# Patient Record
Sex: Male | Born: 1980 | Race: Black or African American | Hispanic: No | Marital: Married | Smoking: Current some day smoker
Health system: Southern US, Community
[De-identification: ages and names within clinical notes are randomized; demographics above are authoritative.]

## PROBLEM LIST (undated history)

## (undated) DIAGNOSIS — A630 Anogenital (venereal) warts: Secondary | ICD-10-CM

## (undated) DIAGNOSIS — E119 Type 2 diabetes mellitus without complications: Secondary | ICD-10-CM

## (undated) DIAGNOSIS — N529 Male erectile dysfunction, unspecified: Secondary | ICD-10-CM

## (undated) DIAGNOSIS — L0292 Furuncle, unspecified: Secondary | ICD-10-CM

## (undated) DIAGNOSIS — I10 Essential (primary) hypertension: Secondary | ICD-10-CM

## (undated) DIAGNOSIS — E785 Hyperlipidemia, unspecified: Secondary | ICD-10-CM

## (undated) DIAGNOSIS — G4730 Sleep apnea, unspecified: Secondary | ICD-10-CM

## (undated) DIAGNOSIS — L409 Psoriasis, unspecified: Secondary | ICD-10-CM

## (undated) DIAGNOSIS — K5792 Diverticulitis of intestine, part unspecified, without perforation or abscess without bleeding: Secondary | ICD-10-CM

## (undated) DIAGNOSIS — E559 Vitamin D deficiency, unspecified: Secondary | ICD-10-CM

## (undated) HISTORY — DX: Vitamin D deficiency, unspecified: E55.9

## (undated) HISTORY — DX: Furuncle, unspecified: L02.92

## (undated) HISTORY — DX: Psoriasis, unspecified: L40.9

## (undated) HISTORY — DX: Essential (primary) hypertension: I10

## (undated) HISTORY — DX: Anogenital (venereal) warts: A63.0

## (undated) HISTORY — DX: Hyperlipidemia, unspecified: E78.5

## (undated) HISTORY — DX: Sleep apnea, unspecified: G47.30

## (undated) HISTORY — PX: OTHER SURGICAL HISTORY: SHX169

## (undated) HISTORY — DX: Male erectile dysfunction, unspecified: N52.9

## (undated) HISTORY — DX: Type 2 diabetes mellitus without complications: E11.9

---

## 2007-06-17 ENCOUNTER — Emergency Department (HOSPITAL_COMMUNITY): Admission: EM | Admit: 2007-06-17 | Discharge: 2007-06-17 | Payer: Self-pay | Admitting: Emergency Medicine

## 2009-03-29 DIAGNOSIS — E559 Vitamin D deficiency, unspecified: Secondary | ICD-10-CM

## 2009-03-29 HISTORY — DX: Vitamin D deficiency, unspecified: E55.9

## 2009-06-27 DIAGNOSIS — A630 Anogenital (venereal) warts: Secondary | ICD-10-CM

## 2009-06-27 HISTORY — DX: Anogenital (venereal) warts: A63.0

## 2009-08-29 ENCOUNTER — Emergency Department (HOSPITAL_COMMUNITY): Admission: EM | Admit: 2009-08-29 | Discharge: 2009-08-29 | Payer: Self-pay | Admitting: Emergency Medicine

## 2010-05-19 LAB — LIPID PANEL
Cholesterol: 210 mg/dL — AB (ref 0–200)
HDL: 50 mg/dL (ref 35–70)
LDL Cholesterol: 129 mg/dL

## 2010-06-28 DIAGNOSIS — L0292 Furuncle, unspecified: Secondary | ICD-10-CM

## 2010-06-28 HISTORY — DX: Furuncle, unspecified: L02.92

## 2010-07-11 ENCOUNTER — Inpatient Hospital Stay (HOSPITAL_COMMUNITY)
Admission: AD | Admit: 2010-07-11 | Discharge: 2010-07-14 | DRG: 603 | Disposition: A | Discharge status: Home / Self Care | Payer: 59 | Source: Other Acute Inpatient Hospital | Attending: Family Medicine | Admitting: Family Medicine

## 2010-07-11 DIAGNOSIS — F17200 Nicotine dependence, unspecified, uncomplicated: Secondary | ICD-10-CM | POA: Diagnosis present

## 2010-07-11 DIAGNOSIS — I10 Essential (primary) hypertension: Secondary | ICD-10-CM

## 2010-07-11 DIAGNOSIS — B372 Candidiasis of skin and nail: Secondary | ICD-10-CM | POA: Diagnosis present

## 2010-07-11 DIAGNOSIS — L02219 Cutaneous abscess of trunk, unspecified: Principal | ICD-10-CM | POA: Diagnosis present

## 2010-07-11 DIAGNOSIS — L408 Other psoriasis: Secondary | ICD-10-CM | POA: Diagnosis present

## 2010-07-11 DIAGNOSIS — L03119 Cellulitis of unspecified part of limb: Secondary | ICD-10-CM

## 2010-07-11 DIAGNOSIS — L02419 Cutaneous abscess of limb, unspecified: Secondary | ICD-10-CM

## 2010-07-11 DIAGNOSIS — L03319 Cellulitis of trunk, unspecified: Principal | ICD-10-CM | POA: Diagnosis present

## 2010-07-11 DIAGNOSIS — E785 Hyperlipidemia, unspecified: Secondary | ICD-10-CM

## 2010-07-11 DIAGNOSIS — L0591 Pilonidal cyst without abscess: Secondary | ICD-10-CM | POA: Diagnosis present

## 2010-07-11 LAB — BASIC METABOLIC PANEL
Calcium: 9.1 mg/dL (ref 8.4–10.5)
GFR calc Af Amer: >60 mL/min (ref >60)
GFR calc non Af Amer: >60 mL/min (ref >60)
Glucose, Bld: 96 mg/dL (ref 70–99)
Potassium: 3.6 mEq/L (ref 3.5–5.1)
Sodium: 137 mEq/L (ref 135–145)

## 2010-07-11 LAB — CBC
HCT: 41.4 % (ref 39.0–52.0)
MCHC: 34.5 g/dL (ref 30.0–36.0)
Platelets: 325 10*3/uL (ref 150–400)
RDW: 12.8 % (ref 11.5–15.5)
WBC: 16.3 10*3/uL — ABNORMAL HIGH (ref 4.0–10.5)

## 2010-07-11 LAB — SURGICAL PCR SCREEN
MRSA, PCR: NEGATIVE
Staphylococcus aureus: NEGATIVE

## 2010-07-12 ENCOUNTER — Other Ambulatory Visit: Payer: Self-pay | Admitting: General Surgery

## 2010-07-12 LAB — RPR: RPR Ser Ql: NONREACTIVE

## 2010-07-12 LAB — HIV ANTIBODY (ROUTINE TESTING W REFLEX): HIV: NONREACTIVE

## 2010-07-13 LAB — CBC
MCH: 30.8 pg (ref 26.0–34.0)
MCHC: 34 g/dL (ref 30.0–36.0)
Platelets: 330 10*3/uL (ref 150–400)

## 2010-07-14 DIAGNOSIS — L02419 Cutaneous abscess of limb, unspecified: Secondary | ICD-10-CM

## 2010-07-14 DIAGNOSIS — I10 Essential (primary) hypertension: Secondary | ICD-10-CM

## 2010-07-14 DIAGNOSIS — L03119 Cellulitis of unspecified part of limb: Secondary | ICD-10-CM

## 2010-07-14 DIAGNOSIS — E785 Hyperlipidemia, unspecified: Secondary | ICD-10-CM

## 2010-07-14 LAB — CULTURE, ROUTINE-ABSCESS

## 2010-07-14 LAB — WOUND CULTURE: Culture: NO GROWTH

## 2010-07-17 NOTE — Op Note (Signed)
  NAMEWIRT, HEMMERICH               ACCOUNT NO.:  [id number]  MEDICAL RECORD NO.:  [id number]           PATIENT TYPE:  I  LOCATION:  5508                         FACILITY:  [facility]  PHYSICIAN:  Gabrielle Dare. Janee Morn, M.D.DATE OF BIRTH:  08-24-1980  DATE OF PROCEDURE:  07/12/2010 DATE OF DISCHARGE:                              OPERATIVE REPORT   PREOPERATIVE DIAGNOSIS:  Left groin abscess.  POSTOPERATIVE DIAGNOSIS:  Left groin abscess.  PROCEDURE:  Incision and drainage and debridement of left groin.  SURGEON:  Gabrielle Dare. Janee Morn, MD.  ANESTHESIA:  General endotracheal.  HISTORY OF PRESENT ILLNESS:  Mr. Grahn is a 30 year old African American gentleman who had a left groin cellulitis times approximately 1 week. He had a I and D done at [facility] and was given some intramuscular antibiotics, but continued to worsen, was admitted to the hospital.  We are proceeding with operative incision, drainage, and debridement.  PROCEDURE IN DETAIL:  Informed consent was obtained.  The patient was identified in the preop holding area.  He is on antibiotic regimen.  His site was marked.  He was brought to the operating room.  General endotracheal anesthesia was administered by the Anesthesia staff.  His left groin was prepped and draped in sterile fashion.  We did a time-out procedure.  Next, the wound was further explored.  There was a significant amount of induration both cephalad and caudal to his inguinal crease on the left side anteriorly, extending laterally from the I and D site.  The I and D site had frank purulent drainage.  This was sent for aerobic and anaerobic cultures.  Next, an elliptical incision was made to encompass all the indurated tissue going above and below at the groin crease.  This was all excised.  There was purulent material within the tissues underneath.  There was no frank necrotizing infection.  The ellipse was extended out laterally more because there was  another pocket of pus out that way.  Next, there was no further undrained areas of pus.  The wound was copiously irrigated with saline. No further extension of indurated tissue or pus was noted.  The hemostasis was obtained with Bovie cautery.  The wound was irrigated somewhat further and then packed with saline-soaked Kerlix gauze followed by sterile gauze dressing.  Sponge, needle, and instrument counts were all correct.  The patient tolerated the procedure without any apparent complications, was taken to recovery in stable condition.  We will plan dressing change tomorrow for reevaluation of the wound.     Gabrielle Dare Janee Morn, M.D.     BET/MEDQ  D:  07/12/2010  T:  07/12/2010  Job:  [id number]  cc:   Deniece Portela A. Sheffield Slider, M.D. [facility]  Electronically Signed by Violeta Gelinas M.D. on 07/17/2010 10:57:15 AM

## 2010-07-18 LAB — CULTURE, BLOOD (ROUTINE X 2)
Culture  Setup Time: 201204150018
Culture: NO GROWTH

## 2010-07-19 LAB — ANAEROBIC CULTURE

## 2010-07-20 NOTE — Consult Note (Signed)
Bruce Macias, Bruce Macias               ACCOUNT NO.:  [id number]  MEDICAL RECORD NO.:  [id number]           PATIENT TYPE:  I  LOCATION:  5508                         FACILITY:  [facility]  PHYSICIAN:  Mary Sella. Andrey Campanile, MD     DATE OF BIRTH:  01-11-1981  DATE OF CONSULTATION:  07/11/2010 DATE OF DISCHARGE:                                CONSULTATION   REQUESTING PHYSICIAN:  [name], MD, at the [facility] Teaching Service with the attending being Dr. Sheffield Slider.  PHYSICIAN PERFORMING CONSULTATION:  Mary Sella. Andrey Campanile, MD  REASON FOR CONSULTATION:  Left groin abscess and cellulitis.  CHIEF COMPLAINT:  I am having infection.  HISTORY OF PRESENT ILLNESS:  The patient is a 30 year old morbidly obese African male with history of psoriasis who has left groin cellulitis for about a week. old morbidly obese African male with history of psoriasis who has left groin cellulitis for about a week.  He said it started off as a pimple that suddenly got larger and became more tender over the past couple of days.  He initially went to [facility] 3 days ago where he had a small incision and drainage with the wound culture, and was given an injection of an antibiotic.  He was also given prescription for doxycycline.  He went back yesterday for another wound check and got another injection of an antibiotic.  He returned today on Saturday.  Since there had been no improvement and potentially worsening of the cellulitis, he was sent over to [facility] for admission to the teaching service.  He denies any other history of abscesses.  He does have a history of a pilonidal cyst that has been drained in the past by Dr. Donell Beers in her office.  He reports some subjective fevers and chills.  He denies any weight loss. He denies any night sweats.  PAST MEDICAL HISTORY: 1. Morbid obesity. 2. Psoriasis. 3. Hypertension. 4. History of pilonidal cyst.  PAST SURGICAL HISTORY:  Incision and drainage of pilonidal cyst in an office setting.  MEDICATIONS AT HOME: 1. Soriatane. 2. Taclonex. 3.  Lisinopril. 4. Simvastatin. 5. Chantix.  HOSPITAL MEDICATIONS:  Vancomycin, Zosyn, lisinopril, and simvastatin.  ALLERGIES:  No known drug allergies.  REVIEW OF SYSTEMS:  He denies any chest pain, dyspnea on exertion, diarrhea, or constipation.  He does endorse significant snoring and some sleepiness during the daytime.  He does have some intermittent drainage from his pilonidal cyst.  He does not have any pain in that area currently.  He does have left groin pain.  Otherwise, comprehensive 12- point review of systems is negative except as mentioned in the HPI.  FAMILY HISTORY:  Significant for obesity.  SOCIAL HISTORY:  He is married.  He is smoking, but he is cutting down. He is taking Chantix.  He has been smoking for about 5 years, but is actually cutting down its use.  He drinks alcohol on occasional basis. He has used marijuana in the past, but that has been several months.  PHYSICAL EXAM:  VITAL SIGNS:  Temperature 99.2, heart rate 106, blood pressure 129/81, 99%. GENERAL:  Morbidly obese, African American male, in no apparent distress. HEENT:  Atraumatic, normocephalic.  Pupils are equal,  round.  No scleral icterus.  No external ear lesions.  Hearing grossly normal.  NECK: Supple and short and thick. PULMONARY:  Lungs are clear to auscultation.  Symmetric chest rise.  No accessory use of muscles. CARDIOVASCULAR:  Regular rhythm.  No murmurs. ABDOMEN:  Morbidly obese.  He has a very large abdomen and large pannus. It is soft, nontender. GU:  In his left inguinal region and left proximal thigh, he has extensive thick cellulitis extending to his proximal thigh medially and it goes laterally.  The area of cellulitis is probably 14 inches x 10 inches.  There is an incision in the left inguinal area that is about 1 inch in length with some packing in it.  There is some active purulent drainage.  There is some fluctuance and significant amount of induration in that area.  The  induration is probably about 8 inches extending laterally.  There are no signs of crepitus. NEURO:  Nonfocal.  Sensation is grossly intact. PSYCHIATRIC:  Alert and oriented x3. SKIN:  See above.  He also has a chronic pilonidal cyst.  It has got two pinpoint areas of drainage to the right of the gluteal cleft.  There is no cellulitis.  However, I can express some trace purulence.  LABS:  Sodium 137, potassium 3.6, chloride 101, bicarb 28, BUN 6, creatinine 0.91, sugar 96, calcium 9.1.  White count 16.3, hematocrit 41, hemoglobin 14, platelet count 325.  IMPRESSION:  A 30 year old [place] American male with; 1. Morbid obesity. 2. Hypertension. 3. Psoriasis. 4. History of tobacco abuse. 5. Chronic pilonidal cyst. 6. Left groin cellulitis and abscess.ines American male with; 1. Morbid obesity. 2. Hypertension. 3. Psoriasis. 4. History of tobacco abuse. 5. Chronic pilonidal cyst. 6. Left groin cellulitis and abscess.  PLAN:  I agree with IV antibiotics.  We will make him n.p.o. after midnight.  The plan will be for him to go to the operating room in the morning for incision and drainage and possible debridement of the left groin and thigh.  He does need definitive management of the pilonidal cyst.  Right now, there are no overlying signs of overt infection.  He does have some chronic drainage.  However, there are no signs of cellulitis.  This is going to be a pretty extensive incision and drainage of his left groin.  Therefore, I would defer management of his pilonidal cyst until he is outpatient heal from the left groin.  I discussed the risks and benefits of the surgery with his wife and his mother and the patient. We discussed the risk of bleeding, infection, need for additional debridements, possibly worsening infection, possibility of a DVT occurrence, possible injury to superficial nerves, and scarring.     Mary Sella. Andrey Campanile, MD     EMW/MEDQ  D:  07/11/2010  T:  07/12/2010  Job:  [id number]  Electronically Signed by Gaynelle Adu M.D. on 07/20/2010 02:56:52 PM

## 2010-07-27 NOTE — H&P (Signed)
Bruce Macias, LIENHARD               ACCOUNT NO.:  [id number]  MEDICAL RECORD NO.:  [id number]           PATIENT TYPE:  I  LOCATION:  5508                         FACILITY:  [facility]  PHYSICIAN:  Jaryd Drew A. Sheffield Slider, M.D.    DATE OF BIRTH:  1980-08-05  DATE OF ADMISSION:  07/11/2010 DATE OF DISCHARGE:                             HISTORY & PHYSICAL   PRIMARY CARE PHYSICIAN:  Dr. Ala Bent at [facility].   CHIEF COMPLAINT:  Left groin abscess.  HISTORY OF PRESENT ILLNESS:  The patient is a 30 year old male with a known history of psoriasis with chronic pilonidal abscess who presents with 1-week history of left groin swelling that started as a small, less than 1 mm raised bump that increased in size with developing surrounding cellulitis.  She was evaluated in the PCP office last Thursday where I and D was performed and he was started on p.o. doxycycline.  He was re- evaluated on Friday and was given IM ceftriaxone.  He was once again evaluated on Saturday where he was found to be febrile with temperature of 100.5, elevated white blood cell count, and to have progressive cellulitis spreading up to his left groin.  He admits to tenderness at the site. He denies nausea, vomiting, diarrhea, penile discharge, chest pain, and shortness of breath.  Code status is full code.  ALLERGIES:  No known drug allergies.  MEDICATIONS: 1. Lasix 20 mg p.o. every other day. 2. Taclonex 0.0055%/0.06% ointment to affected areas as needed. 3. Simvastatin. 4. Lisinopril. 5. Chantix.  PAST MEDICAL HISTORY:  Negative for diabetes.  Positive for hyperlipidemia, hypertension, tobacco one pack per week for 5-6 years. He quit 3 months ago. No previous hospitalizations.  PAST SURGICAL HISTORY:  No surgeries.  SOCIAL HISTORY:  The patient lives with wife and daughter.  He is employed at the [employer].  He quit smoking and drinks one shot of alcohol a week or one drink per week,  occasional marijuana use.  FAMILY HISTORY:  Positive for mother with alopecia.  Sister with hypertension.  REVIEW OF SYSTEMS:  GENERAL:  Positive for fevers.  HEENT:  Positive for headache.  SKIN:  Positive for rash, otherwise negative.  PHYSICAL EXAMINATION:  VITAL SIGNS:  Temperature 99.2, pulse 113, respiratory rate 18, blood pressure 172/105 down to 129/81, and O2 sat 97% on room air.  Weight 166 kg, height 70 inches, BMI 32.5. GENERAL:  Obese male, lying in bed, in no acute distress. HEENT:  Normocephalic, atraumatic.  Moist mucous membranes. NECK:  Supple without lymphadenopathy.  No rash. CVS:  S1 and S2.  Regular rate and rhythm.  No murmurs, rubs, or gallops. LUNGS:  Normal work of breathing.  Bilateral wheezing that clears with cough.  No crackles.  No rhonchi. ABDOMEN:  Obese, soft, nontender, nondistended.  No masses, atrophy, and macular rash per pannus. BACK:  Nontender. GU:  Left groin with 5 x 3 cm area of induration  with purulent serous discharge from the surgical site with packing.  Surrounding erythema extends up left groin. RECTAL:  5 x 5 incision at natal cleft consistent with  pilonidal abscess with cellulitis and induration. EXTREMITIES:  Nontender.  No edema. NEURO:  Alert and oriented x3.  Sensory and motor intact.  Peripheral pulses 5/5 strength in bilateral upper and lower extremities.  STUDIES:  None.  Wound culture performed is pending.  ASSESSMENT AND PLAN:  This is a 30 year old male with left groin abscess and cellulitis. 1. Left groin abscess and cellulitis.  The patient with predisposing     factors with obesity, and what appears to be such as candidal intertrigo,     He has failed outpatient therapy.  Plan for close monitoring, contact precautions.       Obtain blood and wound culture.  Start IV     vancomycin and Zosyn.  Surgicsal consult called, Dr. Andrey Campanile to     evaluate some time this evening.  We will also obtain HIV and RPR     to  evaluate for possible immunocompromised state. 2. Candidal intertrigo, topical fungal cream to abdominal skin folds     and to wounds of the groin. 3. Hypertension.  The patient with known history of hypertension.  His     blood pressure are elevated on admission likely secondary to stress.      They are now back to baseline. Plan to restart home meds and order p.r.n. hydralazine. 4. Hyperlipidemia.  Restart on statin. 5. Fluids, electrolytes, nutrition and gastrointestinal.  N.p.o. for     possible OR this evening.  Normal saline IV at 150 mL per hour. 6. DVT prophylaxis.  Heparin 5000 units subcu b.i.d. 7. Disposition.  Pending clinical improvement.    ______________________________ Bruce Phi, MD   ______________________________ Bruce Macias. Sheffield Slider, M.D.    JF/MEDQ  D:  07/11/2010  T:  07/12/2010  Job:  [id number]  Electronically Signed by Bruce Phi MD on 07/19/2010 01:41:39 AM Electronically Signed by Zachery Dauer M.D. on 07/27/2010 04:41:10 AM

## 2010-08-13 NOTE — Discharge Summary (Signed)
NAMEDETRELL, UMSCHEID               ACCOUNT NO.:  [id number]  MEDICAL RECORD NO.:  [id number]           PATIENT TYPE:  I  LOCATION:  5508                         FACILITY:  [facility]  PHYSICIAN:  Santiago Bumpers. Hensel, M.D.DATE OF BIRTH:  03/21/1981  DATE OF ADMISSION:  07/11/2010 DATE OF DISCHARGE:  07/14/2010                              DISCHARGE SUMMARY   DISCHARGE DIAGNOSES: 1. Hypertension. 2. Left groin abscess and cellulitis. 3. Morbid obesity. 4. History of psoriasis. 5. Chronic pilonidal abscess. 6. Hypertension. 7. Hyperlipidemia. 8. Previous tobacco abuse.  DISCHARGE MEDICATIONS:  New medications at the discharged: 1. Augmentin 500 mg 1 tablet p.o. q.8 for next 10 days. 2. Tylenol Extra Strength 500 mg 2 tablets p.o. q.4 as needed for     pain. 3. Lotrimin 1% cream 1 application topically twice daily as needed. 4. Oxycodone 5 mg IR 10 mg p.o. every 4 hours as needed for pain.  Home medications changed: 1. Lisinopril 20 mg 2 tablets p.o. daily. 2. Simvastatin 20 mg 1 tablet p.o. daily. 3. Soriatane 25 mg 1 capsule p.o. daily. 4. Tramadol 50 mg 1 tablet p.o. daily as needed for pain. 5. Triamcinolone topical ointment 1% one application topically daily.  Home medications stopped: Doxycycline and clindamycin.  CONSULTS:  General Surgery.  PERTINENT LABS AT DISCHARGE:  Blood cultures no growth to date.  No final abscess culture.  Multiple organisms present, non predominant, no staph aureus isolated, no group A strep isolated.  White blood cells 2.8.  HIV negative.  RPR negative.  BRIEF HOSPITAL COURSE: 1. The patient is a 30 year old male who presented with a left groin     abscess and cellulitis, failed outpatient treatment with     doxycycline, clindamycin, and IM ceftriaxone.  The patient was     admitted.  Blood and wound cultures were obtained.  He was started     on IV vanc and Zosyn, and as well as IV fluids.  He was also given     IV Dilaudid and oxycodone  p.r.n. for pain.  Surgery consult was     obtained.  The patient was taken to the OR for I and D of left     groin abscess on July 12, 2010, the patient did well postop.  His     pain was well controlled with IV and then he was then transitioned     to p.o. medications.  The wound care was initially with wet to dry     dressings to be changed twice daily.  The patient was then     reevaluated and was recommended that the patient has negative     pressure wound VAC, and to be set up with home health RN to change     wound VAC 3 times weekly.  At discharge, the patient is doing well.     His white count trending down.  He is afebrile.  His pain is well     controlled and he has home health RN set up to apply and change     negative pressure wound VAC 3 times weekly. 2. Hypertension.  The patient's blood pressure were elevated on his     admission, but did trend down with time.  His home dose of     lisinopril was increased to 40 mg p.o. daily.  The patient did not     require p.r.n. hydralazine. 3. Candidal intertrigo.  The patient has evidence of candidal     intertrigo after his abdominal pannus as well as staples of his     upper thigh.  He was instructed to apply to Lotrimin cream to the     area twice daily as needed. 4. History of hyperlipidemia.  The patient was continued on his home     dose of simvastatin. 5. History of psoriasis.  The patient's right hand in triamcinolone     topical was held during his admission.  He was instructed to resume     both as needed following discharge.  DISCHARGE PHYSICAL EXAM:  At discharge, the patient is in no acute distress.  Heart and lung exam were within normal limits.  Examination of his left groin abscess shows markedly decreased cellulitis.  The areas immediately surrounding the incision was indurated, but there was little drainage from the wound.  The drainage that is present is nonpurulent and serous.  DISCHARGE INSTRUCTIONS:  The  patient is discharged to home with instructions to advance activity slowly.  No limitations on diet.  He was instructed to follow up with Dr. Janee Morn, general surgeon.  He was provided the phone number and is instructed to follow up in 7-10 days. He is also instructed to follow up with his primary care physician, Dr. Ala Bent at [facility].  He is instructed to follow up for hospital followup and for blood pressure check in 1-2 weeks.  He is also instructed to begin home health RN, with a vent with negative pressure wound VAC 3 times per weeks as needed until wound is healed.  DISCHARGE CONDITION:  The patient is discharged to home in stable medical condition.    ______________________________ [name], MD   ______________________________ Santiago Bumpers. Leveda Anna, M.D.    JF/MEDQ  D:  07/19/2010  T:  07/19/2010  Job:  [id number]  Electronically Signed by [name] MD on 08/07/2010 02:59:02 PM Electronically Signed by Doralee Albino M.D. on 08/13/2010 11:00:26 AM

## 2011-04-06 ENCOUNTER — Encounter (INDEPENDENT_AMBULATORY_CARE_PROVIDER_SITE_OTHER): Payer: 59 | Admitting: Physician Assistant

## 2011-04-06 DIAGNOSIS — Z23 Encounter for immunization: Secondary | ICD-10-CM

## 2011-04-06 DIAGNOSIS — F17200 Nicotine dependence, unspecified, uncomplicated: Secondary | ICD-10-CM

## 2011-04-06 DIAGNOSIS — Z0000 Encounter for general adult medical examination without abnormal findings: Secondary | ICD-10-CM

## 2011-04-06 DIAGNOSIS — I10 Essential (primary) hypertension: Secondary | ICD-10-CM

## 2011-04-07 LAB — LIPID PANEL
Cholesterol: 206 mg/dL — AB (ref 0–200)
Triglycerides: 106 mg/dL (ref 40–160)

## 2011-05-04 ENCOUNTER — Ambulatory Visit: Payer: 59 | Admitting: Physician Assistant

## 2011-05-12 ENCOUNTER — Encounter: Payer: Self-pay | Admitting: Family Medicine

## 2011-05-14 ENCOUNTER — Encounter: Payer: Self-pay | Admitting: Family Medicine

## 2011-05-14 ENCOUNTER — Ambulatory Visit (INDEPENDENT_AMBULATORY_CARE_PROVIDER_SITE_OTHER): Payer: 59 | Admitting: Family Medicine

## 2011-05-14 DIAGNOSIS — R7402 Elevation of levels of lactic acid dehydrogenase (LDH): Secondary | ICD-10-CM

## 2011-05-14 DIAGNOSIS — E782 Mixed hyperlipidemia: Secondary | ICD-10-CM

## 2011-05-14 DIAGNOSIS — G4730 Sleep apnea, unspecified: Secondary | ICD-10-CM | POA: Insufficient documentation

## 2011-05-14 DIAGNOSIS — G4733 Obstructive sleep apnea (adult) (pediatric): Secondary | ICD-10-CM

## 2011-05-14 DIAGNOSIS — E669 Obesity, unspecified: Secondary | ICD-10-CM

## 2011-05-14 DIAGNOSIS — R748 Abnormal levels of other serum enzymes: Secondary | ICD-10-CM

## 2011-05-14 DIAGNOSIS — E785 Hyperlipidemia, unspecified: Secondary | ICD-10-CM | POA: Insufficient documentation

## 2011-05-14 DIAGNOSIS — R7401 Elevation of levels of liver transaminase levels: Secondary | ICD-10-CM

## 2011-05-14 DIAGNOSIS — Z6841 Body Mass Index (BMI) 40.0 and over, adult: Secondary | ICD-10-CM | POA: Insufficient documentation

## 2011-05-14 DIAGNOSIS — I10 Essential (primary) hypertension: Secondary | ICD-10-CM

## 2011-05-14 LAB — COMPREHENSIVE METABOLIC PANEL
ALT: 55 U/L — ABNORMAL HIGH (ref 0–53)
AST: 48 U/L — ABNORMAL HIGH (ref 0–37)
Albumin: 4 g/dL (ref 3.5–5.2)
Alkaline Phosphatase: 78 U/L (ref 39–117)
Potassium: 4 mEq/L (ref 3.5–5.3)
Sodium: 138 mEq/L (ref 135–145)
Total Protein: 7 g/dL (ref 6.0–8.3)

## 2011-05-14 MED ORDER — HYDROCHLOROTHIAZIDE 25 MG PO TABS: 25.0000 mg | ORAL_TABLET | Freq: Every day | ORAL | Status: DC

## 2011-05-14 NOTE — Progress Notes (Signed)
  Subjective:    Patient ID: Bruce Macias, male    DOB: 06-Dec-1980, 31 y.o.   MRN: [id number]  HPIAntonio Macias is a 31 y.o. male last seen 04/06/11 for CPE by Kennedy Bucker, PAC.  See scanned note.    Hyperlipidemia - lipids 04/06/10: TC206, LDL 136, HDL 49, trig 106.  Previously on simvastatin - stopped last year - rx ran out.  LFT's last OV slightly elevated - AST 57, ALT 61.  Rare alcohol  1-2 drinks every  Other weekend.  No abd pain, nausea, or vomiting.   Occasional ibuprofen for back pain.  Htn - Started on HCTZ 25 mg qd 04/06/11.  No new side effects with meds. Weight 404 to 402 since last ov.  No specific exercise outside of work - works 2 jobs.  Rare sweet teas or sodas now - has cut back.    Hx of OSA - using CPAP now - past month every night.    Review of Systems  Constitutional: Negative for fever, chills, fatigue and unexpected weight change.  HENT: Negative for sore throat, trouble swallowing and tinnitus.   Eyes: Negative for visual disturbance.  Respiratory: Negative for cough, chest tightness and shortness of breath.   Cardiovascular: Negative for chest pain, palpitations and leg swelling.  Gastrointestinal: Negative for nausea, vomiting, abdominal pain, diarrhea, constipation and blood in stool.  Genitourinary: Negative for difficulty urinating.  Neurological: Negative for dizziness, light-headedness and headaches.       Objective:   Physical Exam  Constitutional: He is oriented to person, place, and time. He appears well-developed and well-nourished.  HENT:  Head: Normocephalic and atraumatic.  Eyes: EOM are normal. Pupils are equal, round, and reactive to light.  Neck: No JVD present. Carotid bruit is not present. No thyromegaly present.  Cardiovascular: Normal rate, regular rhythm and normal heart sounds.   No murmur heard. Pulmonary/Chest: Effort normal and breath sounds normal. He has no rales.  Abdominal: Soft. There is no tenderness. There is no rebound and no  guarding.  Musculoskeletal: He exhibits no edema.  Neurological: He is alert and oriented to person, place, and time.  Skin: Skin is warm and dry.  Psychiatric: He has a normal mood and affect.          Assessment & Plan:   1. HTN (hypertension)  Comprehensive metabolic panel  2. Obesity    3. Hyperlipidemia  Comprehensive metabolic panel  4. OSA (obstructive sleep apnea)    5. Elevated liver enzymes     Improved BP control.  Stressed importance of continued weight loss through exercise and portion control.  Continue CPAP  Elevated LFT's possibly due to fatty liver with obesity.  Recheck CMP, and consider low dose statin if LFT's stable, and then recheck LFT's 4-6 weeks into statin.  Recheck with Helmut Muster in 3 months.

## 2011-05-14 NOTE — Patient Instructions (Signed)
Continue to work on weight loss with exercise and portion control.  If liver enzymes ok , may be able to restart a cholesterol medicine.  Expect a call about lab results in next 7-10 days. Return to the clinic or go to the nearest emergency room if any of your symptoms worsen or new symptoms occur.

## 2011-05-20 ENCOUNTER — Telehealth: Payer: Self-pay

## 2011-05-24 ENCOUNTER — Telehealth: Payer: Self-pay

## 2011-05-24 NOTE — Telephone Encounter (Signed)
Pt CB to get lab results from last OV. Gave pt results and instructions from MD. Pt agreed to f/up. Called in Lovastatin Rx into [facility]

## 2011-06-01 ENCOUNTER — Encounter: Payer: Self-pay | Admitting: Physician Assistant

## 2011-08-10 ENCOUNTER — Ambulatory Visit: Payer: 59 | Admitting: Physician Assistant

## 2011-08-24 ENCOUNTER — Other Ambulatory Visit: Payer: Self-pay | Admitting: Family Medicine

## 2011-08-28 ENCOUNTER — Ambulatory Visit: Payer: 59

## 2011-08-28 ENCOUNTER — Ambulatory Visit (INDEPENDENT_AMBULATORY_CARE_PROVIDER_SITE_OTHER): Payer: 59 | Admitting: Family Medicine

## 2011-08-28 VITALS — BP 131/87 | HR 91 | Temp 97.9°F | Resp 20 | Ht 69.0 in | Wt 398.0 lb

## 2011-08-28 DIAGNOSIS — M25562 Pain in left knee: Secondary | ICD-10-CM

## 2011-08-28 DIAGNOSIS — M25569 Pain in unspecified knee: Secondary | ICD-10-CM

## 2011-08-28 MED ORDER — METHYLPREDNISOLONE 4 MG PO TABS: ORAL_TABLET | ORAL | Status: DC

## 2011-08-28 NOTE — Progress Notes (Signed)
This 31 year old gentleman comes in with complaint of left knee pain. 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 He seemed to stumble as he went for lap as he tripped over a stump. He had immediate pain but seemed to take it off and Lying. The next day, the knee was stiff and sore. He's noted some popping in the knee as well. He's had no significant swelling and no point tenderness. Most of the aching seems to be in the anterior portion of the knee.  Patient has a history of a previous injury which seemed to resolve several years ago. He was in pretty good condition when this happened.  Objective: Obese man in no acute distress. Left knee exam: Knee inspection: Normal , no effusion Knee palpation: Nontender Knee ligamentous testing: No laxity or pain The range of motion: Full Skin: No ecchymosis or abrasions. [facility] reading (PRIMARY) by  Dr. Milus Glazier:  Left Knee  Negative for bony abnormalities or effusion  Assessment: Knee sprain. Cannot rule out cartilage injury as well.  Plan: Medrol 4 mg, 2 daily x5 days and then recheck.

## 2011-08-28 NOTE — Patient Instructions (Addendum)
Knee Sprain  You have a knee sprain. Sprains are painful injuries to the joints. A sprain is a partial or complete tearing of ligaments. Ligaments are tough, fibrous tissues that hold bones together at the joints. A strain (sprain) has occurred when a ligament is stretched or damaged. This injury may take several weeks to heal. This is often the same length of time as a bone fracture (break in bone) takes to heal. Even though a fracture (bone break) may not have occurred, the recovery times may be similar.  HOME CARE INSTRUCTIONS   · Rest the injured area for as long as directed by your caregiver. Then slowly start using the joint as directed by your caregiver and as the pain allows. Use crutches as directed. If the knee was splinted or casted, continue use and care as directed. If an ace bandage has been applied today, it should be removed and reapplied every 3 to 4 hours. It should not be applied tightly, but firmly enough to keep swelling down. Watch toes and feet for swelling, bluish discoloration, coldness, numbness or excessive pain. If any of these symptoms occur, remove the ace bandage and reapply more loosely.If these symptoms persist, seek medical attention.  · For the first 24 hours, lie down. Keep the injured extremity elevated on two pillows.  · Apply ice to the injured area for 15 to 20 minutes every couple hours. Repeat this 3 to 4 times per day for the first 48 hours. Put the ice in a plastic bag and place a towel between the bag of ice and your skin.  · Wear any splinting, casting, or elastic bandage applications as instructed.  · Only take over-the-counter or prescription medicines for pain, discomfort, or fever as directed by your caregiver. Do not use aspirin immediately after the injury unless instructed by your caregiver. Aspirin can cause increased bleeding and bruising of the tissues.  · If you were given crutches, continue to use them as instructed. Do not resume weight bearing on the  affected extremity until instructed.  Persistent pain and inability to use the injured area as directed for more than 2 to 3 days are warning signs. If this happens you should see a caregiver for a follow-up visit as soon as possible. Initially, a hairline fracture (this is the same as a broken bone) may not be evident on x-rays. Persistent pain and swelling indicate that further evaluation, non-weight bearing (use of crutches as instructed), and/or further x-rays are indicated. X-rays may sometimes not show a small fracture until a week or ten days later. Make a follow-up appointment with your own caregiver or one to whom we have referred you. A radiologist (specialist in reading x-rays) may re-read your X-rays. Make sure you know how you are to get your x-ray results. Do not assume everything is normal if you do not hear from us.  SEEK MEDICAL CARE IF:   · Bruising, swelling, or pain increases.  · You have cold or numb toes  · You have continuing difficulty or pain with walking.  SEEK IMMEDIATE MEDICAL CARE IF:   · Your toes are cold, numb or blue.  · The pain is not responding to medications and continues to stay the same or get worse.  MAKE SURE YOU:   · Understand these instructions.  · Will watch your condition.  · Will get help right away if you are not doing well or get worse.  Document Released: 03/15/2005 Document Revised: 03/04/2011 Document Reviewed: 02/27/2007    [employer]® Patient Information ©2012 [employer].

## 2011-09-07 ENCOUNTER — Ambulatory Visit: Payer: 59 | Admitting: Physician Assistant

## 2011-11-01 ENCOUNTER — Encounter: Payer: Self-pay | Admitting: Physician Assistant

## 2012-04-12 ENCOUNTER — Ambulatory Visit (INDEPENDENT_AMBULATORY_CARE_PROVIDER_SITE_OTHER): Payer: 59 | Admitting: Family Medicine

## 2012-04-12 VITALS — BP 161/82 | HR 114 | Temp 98.7°F | Resp 22 | Ht 69.5 in | Wt 398.0 lb

## 2012-04-12 DIAGNOSIS — L0291 Cutaneous abscess, unspecified: Secondary | ICD-10-CM

## 2012-04-12 DIAGNOSIS — M79609 Pain in unspecified limb: Secondary | ICD-10-CM

## 2012-04-12 DIAGNOSIS — L0390 Cellulitis, unspecified: Secondary | ICD-10-CM

## 2012-04-12 LAB — POCT CBC
Hemoglobin: 15.9 g/dL (ref 14.1–18.1)
Lymph, poc: 3 (ref 0.6–3.4)
MCH, POC: 29.1 pg (ref 27–31.2)
MCHC: 30.4 g/dL — AB (ref 31.8–35.4)
MID (cbc): 1 — AB (ref 0–0.9)
MPV: 7.5 fL (ref 0–99.8)
POC LYMPH PERCENT: 17 %L (ref 10–50)
POC MID %: 5.9 %M (ref 0–12)
WBC: 17.4 10*3/uL — AB (ref 4.6–10.2)

## 2012-04-12 MED ORDER — KETOROLAC TROMETHAMINE 60 MG/2ML IM SOLN
60.0000 mg | Freq: Once | INTRAMUSCULAR | Status: AC
  Administered 2012-04-12: 60 mg via INTRAMUSCULAR

## 2012-04-12 MED ORDER — SULFAMETHOXAZOLE-TRIMETHOPRIM 800-160 MG PO TABS: 2.0000 | ORAL_TABLET | Freq: Two times a day (BID) | ORAL | Status: DC

## 2012-04-12 MED ORDER — OXYCODONE-ACETAMINOPHEN 5-325 MG PO TABS: 1.0000 | ORAL_TABLET | ORAL | Status: DC | PRN

## 2012-04-12 NOTE — Progress Notes (Signed)
   Patient ID: Bruce Macias MRN: [id number], DOB: 1981-03-14, 32 y.o. Date of Encounter: 04/12/2012, 8:07 PM    PROCEDURE NOTE: Verbal consent obtained. Risks and benefits of the procedure were explained to the patient. Patient made an informed decision to proceed with the procedure. Betadine prep per usual protocol. Local anesthesia obtained with 2% plain lidocaine 5 cc.  1.5 inch incision made with 11 blade along lesion.  Culture taken. Copious purulence expressed. Lesion explored revealing no loculations. Irrigated with normal saline. Packed with large amount of 1/2 plain packing Dressed. Wound care instructions including precautions with patient. Patient tolerated the procedure well. Recheck in 24 hours with Ms. Marte, PA-C. First dose of Bactrim given in office tonight. ER precautions discussed. Advised he can contact us 24 hours via phone. Advised to check his temperature at home. Consider Rocephin.  Smaller lesion at 12 o'clock to above wound also numbed with 2% plain lidocaine 1 cc. Lanced with 11 blade. 0.5 cm incision made. Small amount of purulence expressed. Irrigated with normal saline. Wound explored, no loculations revealed. Wound dressed.   Labs: Results for orders placed in visit on 04/12/12  POCT CBC      Component Value Range   WBC 17.4 (*) 4.6 - 10.2 K/uL   Lymph, poc 3.0  0.6 - 3.4   POC LYMPH PERCENT 17.0  10 - 50 %L   MID (cbc) 1.0 (*) 0 - 0.9   POC MID % 5.9  0 - 12 %M   POC Granulocyte 13.4 (*) 2 - 6.9   Granulocyte percent 77.1  37 - 80 %G   RBC 5.47  4.69 - 6.13 M/uL   Hemoglobin 15.9  14.1 - 18.1 g/dL   HCT, POC 57.8  46.9 - 53.7 %   MCV 95.6  80 - 97 fL   MCH, POC 29.1  27 - 31.2 pg   MCHC 30.4 (*) 31.8 - 35.4 g/dL   RDW, POC 62.9     Platelet Count, POC 463 (*) 142 - 424 K/uL   MPV 7.5  0 - 99.8 fL       Signed, Eula Listen, PA-C 04/12/2012 8:07 PM

## 2012-04-12 NOTE — Progress Notes (Signed)
  Subjective:    Patient ID: Bruce Macias, male    DOB: 11-14-1980, 32 y.o.   MRN: [id number] Chief Complaint  Patient presents with  . spot on back    HPI  [name] has had a h/o abscess and boils which have needed to be lanced and drained at times, has had to "have surgery" on them.  He has developed one at the top of his gluteal crease for about a wk which has steadily worsened and become very painful- painful to sit and bend over.  While he was in the office waiting to be seen by a provider, it actually spontaneously burst and has had copious odiferous liquid purulent drainage - spilling down his buttocks, clothing, onto floor, etc. History reviewed. No pertinent past medical history. History reviewed. No pertinent past surgical history. No current outpatient prescriptions on file prior to visit.   No Known Allergies  Review of Systems  Constitutional: Positive for diaphoresis and fatigue. Negative for fever, chills, activity change, appetite change and unexpected weight change.  Cardiovascular: Negative for leg swelling.  Gastrointestinal: Negative for nausea, vomiting, abdominal pain, diarrhea, constipation and rectal pain.  Musculoskeletal: Positive for myalgias and back pain. Negative for joint swelling and gait problem.  Skin: Positive for rash and wound.  Neurological: Negative for weakness and numbness.  Hematological: Negative for adenopathy. Does not bruise/bleed easily.  Psychiatric/Behavioral: Positive for sleep disturbance.      BP 161/82  Pulse 114  Temp 98.7 F (37.1 C) (Oral)  Resp 22  Ht 5' 9.5" (1.765 m)  Wt 398 lb (180.532 kg)  BMI 57.93 kg/m2  SpO2 93% Objective:   Physical Exam  Constitutional: He is oriented to person, place, and time. He appears well-developed and well-nourished. No distress.       Morbidly obese  HENT:  Head: Normocephalic and atraumatic.  Eyes: No scleral icterus.  Pulmonary/Chest: Effort normal.  Neurological: He is alert and  oriented to person, place, and time.  Skin: Skin is warm and dry. Lesion noted. He is not diaphoretic.       Large erythematous exquisitely tender mass approx 1 x 3 in on upper right gluteal crease with surrounding fluctuance up to 5 in dm - poorly defined and very tender. Spontaneously draining copious purulent thin material.  Psychiatric: He has a normal mood and affect. His behavior is normal.      Assessment & Plan:  Abscess -Gave toradol 60mg  IM x 1 prior to I&D by Eula Listen, PA-C in office tonight. Start Bactrim DS tonight x 10d. Wound clx P.   Recheck in office tomorrow.  Meds ordered this encounter  Medications  . oxyCODONE-acetaminophen (ROXICET) 5-325 MG per tablet    Sig: Take 1 tablet by mouth every 4 (four) hours as needed for pain.    Dispense:  20 tablet    Refill:  0  . sulfamethoxazole-trimethoprim (BACTRIM DS,SEPTRA DS) 800-160 MG per tablet    Sig: Take 2 tablets by mouth 2 (two) times daily.    Dispense:  40 tablet    Refill:  0  . ketorolac (TORADOL) injection 60 mg    Sig:

## 2012-04-13 ENCOUNTER — Ambulatory Visit (INDEPENDENT_AMBULATORY_CARE_PROVIDER_SITE_OTHER): Payer: 59 | Admitting: Physician Assistant

## 2012-04-13 VITALS — BP 121/84 | HR 105 | Temp 98.1°F | Resp 18 | Ht 69.5 in | Wt 398.0 lb

## 2012-04-13 DIAGNOSIS — L0231 Cutaneous abscess of buttock: Secondary | ICD-10-CM

## 2012-04-13 DIAGNOSIS — L03317 Cellulitis of buttock: Secondary | ICD-10-CM

## 2012-04-13 LAB — POCT CBC
Granulocyte percent: 67.5 %G (ref 37–80)
HCT, POC: 50.6 % (ref 43.5–53.7)
Hemoglobin: 15.5 g/dL (ref 14.1–18.1)
Lymph, poc: 3.1 (ref 0.6–3.4)
MCH, POC: 29.4 pg (ref 27–31.2)
MCHC: 30.6 g/dL — AB (ref 31.8–35.4)
MCV: 95.6 fL (ref 80–97)
MID (cbc): 0.7 (ref 0–0.9)
MPV: 7.6 fL (ref 0–99.8)
POC Granulocyte: 7.9 — AB (ref 2–6.9)
POC LYMPH PERCENT: 26.5 %L (ref 10–50)
POC MID %: 6 %M (ref 0–12)
Platelet Count, POC: 442 10*3/uL — AB (ref 142–424)
RBC: 5.28 M/uL (ref 4.69–6.13)
RDW, POC: 14 %
WBC: 11.7 10*3/uL — AB (ref 4.6–10.2)

## 2012-04-13 MED ORDER — CEFTRIAXONE SODIUM 1 G IJ SOLR
1.0000 g | INTRAMUSCULAR | Status: DC
  Administered 2012-04-13: 1 g via INTRAMUSCULAR

## 2012-04-13 NOTE — Progress Notes (Signed)
Patient ID: Bruce Macias MRN: [id number], DOB: [birth year] 32 y.o. Date of Encounter: 04/13/2012, 11:49 AM  Chief Complaint: Wound care   See previous note  HPI: 32 y.o. y/o male presents for wound care s/p I&D on 04/12/12 Doing well No issues or complaints. Pain has significantly improved since last night.   Afebrile/ no chills No nausea or vomiting Tolerating Bactrim Pain improved.  Daily dressing change Previous note reviewed  No past medical history on file.   Home Meds: Prior to Admission medications   Medication Sig Start Date End Date Taking? Authorizing Provider  oxyCODONE-acetaminophen (ROXICET) 5-325 MG per tablet Take 1 tablet by mouth every 4 (four) hours as needed for pain. 04/12/12  Yes Sherren Mocha, MD  sulfamethoxazole-trimethoprim (BACTRIM DS,SEPTRA DS) 800-160 MG per tablet Take 2 tablets by mouth 2 (two) times daily. 04/12/12  Yes Sherren Mocha, MD    Allergies: No Known Allergies  ROS: Constitutional: Afebrile, no chills Cardiovascular: negative for chest pain or palpitations Dermatological: Positive for wound. Negative for erythema or warmth. Positive for pain.   GI: No nausea or vomiting   EXAM: Physical Exam: Blood pressure 121/84, pulse 105, temperature 98.1 F (36.7 C), temperature source Oral, resp. rate 18, height 5' 9.5" (1.765 m), weight 398 lb (180.532 kg), SpO2 96.00%., Body mass index is 57.93 kg/(m^2). General: Well developed, well nourished, in no acute distress. Nontoxic appearing. Head: Normocephalic, atraumatic, sclera non-icteric.  Neck: Supple. Lungs: Breathing is unlabored. Heart: Normal rate. Skin:  Warm and moist. Dressing and packing in place. Minimal induration.  Slight surrounding erythema with tenderness to palpation. Neuro: Alert and oriented X 3. Moves all extremities spontaneously. Normal gait.  Psych:  Responds to questions appropriately with a normal affect.       PROCEDURE: Dressing and packing removed. Minimal  purulence expressed Wound bed healthy Irrigated with 2% plain lidocaine 10 cc. Repacked with 1/2" plain packing.   Dressing applied  LAB: Culture: pending Rocephin 1 gram given today in office.   Results for orders placed in visit on 04/13/12  POCT CBC      Component Value Range   WBC 11.7 (*) 4.6 - 10.2 K/uL   Lymph, poc 3.1  0.6 - 3.4   POC LYMPH PERCENT 26.5  10 - 50 %L   MID (cbc) 0.7  0 - 0.9   POC MID % 6.0  0 - 12 %M   POC Granulocyte 7.9 (*) 2 - 6.9   Granulocyte percent 67.5  37 - 80 %G   RBC 5.28  4.69 - 6.13 M/uL   Hemoglobin 15.5  14.1 - 18.1 g/dL   HCT, POC 28.4  13.2 - 53.7 %   MCV 95.6  80 - 97 fL   MCH, POC 29.4  27 - 31.2 pg   MCHC 30.6 (*) 31.8 - 35.4 g/dL   RDW, POC 44.0     Platelet Count, POC 442 (*) 142 - 424 K/uL   MPV 7.6  0 - 99.8 fL     A/P: 32 y.o. y/o male with buttocks cellulitis/abscess as above s/p I&D on 04/12/12.  Wound care per above Continue Bactrim Pain well controlled Daily dressing changes and warm compresses as directed Recheck 48 hours  Signed, Rhoderick Moody, PA-C 04/13/2012 11:49 AM

## 2012-04-15 ENCOUNTER — Ambulatory Visit (INDEPENDENT_AMBULATORY_CARE_PROVIDER_SITE_OTHER): Payer: 59 | Admitting: Physician Assistant

## 2012-04-15 VITALS — BP 151/92 | HR 87 | Temp 98.4°F | Resp 18 | Ht 69.5 in | Wt 398.0 lb

## 2012-04-15 DIAGNOSIS — L0231 Cutaneous abscess of buttock: Secondary | ICD-10-CM

## 2012-04-15 LAB — POCT CBC
HCT, POC: 50 % (ref 43.5–53.7)
Lymph, poc: 3.1 (ref 0.6–3.4)
MCHC: 30.6 g/dL — AB (ref 31.8–35.4)
MCV: 96.2 fL (ref 80–97)
POC Granulocyte: 5.8 (ref 2–6.9)
POC LYMPH PERCENT: 32.7 %L (ref 10–50)
RDW, POC: 14 %

## 2012-04-15 NOTE — Progress Notes (Signed)
   Patient ID: Bruce Macias MRN: [id number], DOB: [birth year] 32 y.o. Date of Encounter: 04/15/2012, 3:10 PM  Primary Physician: Nilda Simmer, MD  Chief Complaint: Wound care   See previous note  HPI: 32 y.o. y/o male presents for wound care s/p I&D on 04/12/12 Doing well No issues or complaints Afebrile/ no chills No nausea or vomiting Tolerating Bactrim Pain much improved Daily dressing change Packing came out the previous day Not taking the oxycodone Taking ibuprofen for pain Previous note reviewed  No past medical history on file.   Home Meds: Prior to Admission medications   Medication Sig Start Date End Date Taking? Authorizing Provider  oxyCODONE-acetaminophen (ROXICET) 5-325 MG per tablet Take 1 tablet by mouth every 4 (four) hours as needed for pain. 04/12/12  No Sherren Mocha, MD  sulfamethoxazole-trimethoprim (BACTRIM DS,SEPTRA DS) 800-160 MG per tablet Take 2 tablets by mouth 2 (two) times daily. 04/12/12  Yes Sherren Mocha, MD    Allergies: No Known Allergies  ROS: Constitutional: Afebrile, no chills Cardiovascular: negative for chest pain or palpitations Dermatological: Positive for wound and pain. Negative for erythema, or warmth  GI: No nausea or vomiting   EXAM: Physical Exam: Blood pressure 151/92, pulse 87, temperature 98.4 F (36.9 C), temperature source Oral, resp. rate 18, height 5' 9.5" (1.765 m), weight 398 lb (180.532 kg), SpO2 98.00%., Body mass index is 57.93 kg/(m^2). General: Well developed, well nourished, in no acute distress. Nontoxic appearing. Head: Normocephalic, atraumatic, sclera non-icteric.  Neck: Supple. Lungs: Breathing is unlabored. Heart: Normal rate. Skin:  Warm and moist. Dressing in place without packing. Improved induration without erythema. Mild tenderness to palpation along the inferior aspect. Neuro: Alert and oriented X 3. Moves all extremities spontaneously. Normal gait.  Psych:  Responds to questions appropriately with a  normal affect.   PROCEDURE: Dressing removed. Original incision reopened with pickups. No further purulence expressed. Wound bed healthy. Irrigated with 1% plain lidocaine 5 cc. Repacked with 1/4 inch plain packing. Dressing applied  LAB: Culture: pending, reviewed.  A/P: 32 y.o. y/o male with improving cellulitis/abscess as above s/p I&D on 04/12/12 -Wound care per above -Continue Bactrim -Pain well controlled -Daily dressing changes -Recheck 48 hours  Signed, Eula Listen, PA-C 04/15/2012 3:10 PM

## 2012-04-16 LAB — WOUND CULTURE

## 2012-04-17 ENCOUNTER — Ambulatory Visit (INDEPENDENT_AMBULATORY_CARE_PROVIDER_SITE_OTHER): Payer: 59 | Admitting: Physician Assistant

## 2012-04-17 VITALS — BP 122/88 | HR 106 | Temp 98.7°F | Resp 18 | Ht 69.5 in | Wt 397.8 lb

## 2012-04-17 DIAGNOSIS — L0231 Cutaneous abscess of buttock: Secondary | ICD-10-CM

## 2012-04-17 NOTE — Progress Notes (Signed)
   Patient ID: Bruce Macias MRN: [id number], DOB: 1980-11-17 32 y.o. Date of Encounter: 04/17/2012, 12:02 PM  Primary Physician: Nilda Simmer, MD  Chief Complaint: Wound care   See previous note  HPI: 32 y.o. y/o male presents for wound care s/p I&D on 04/12/12 Doing well No issues or complaints Afebrile/ no chills No nausea or vomiting Tolerating Bactrim Pain resolved Not needing pain medication Daily dressing change Previous note reviewed  No past medical history on file.   Home Meds: Prior to Admission medications   Medication Sig Start Date End Date Taking? Authorizing Provider  oxyCODONE-acetaminophen (ROXICET) 5-325 MG per tablet Take 1 tablet by mouth every 4 (four) hours as needed for pain. 04/12/12  Yes Sherren Mocha, MD  sulfamethoxazole-trimethoprim (BACTRIM DS,SEPTRA DS) 800-160 MG per tablet Take 2 tablets by mouth 2 (two) times daily. 04/12/12  Yes Sherren Mocha, MD    Allergies: No Known Allergies  ROS: Constitutional: Afebrile, no chills Cardiovascular: negative for chest pain or palpitations Dermatological: Positive for wound. Negative for erythema, pain, or warmth  GI: No nausea or vomiting   EXAM: Physical Exam: Blood pressure 122/88, pulse 106, temperature 98.7 F (37.1 C), temperature source Oral, resp. rate 18, height 5' 9.5" (1.765 m), weight 397 lb 12.8 oz (180.441 kg), SpO2 94.00%., Body mass index is 57.90 kg/(m^2). General: Well developed, well nourished, in no acute distress. Nontoxic appearing. Head: Normocephalic, atraumatic, sclera non-icteric.  Neck: Supple. Lungs: Breathing is unlabored. Heart: Normal rate. Skin:  Warm and moist. Dressing and packing in place. Improved induration without erythema, or tenderness to palpation including no TTP along inferior aspect. Neuro: Alert and oriented X 3. Moves all extremities spontaneously. Normal gait.  Psych:  Responds to questions appropriately with a normal affect.   PROCEDURE: Dressing and  packing removed. No purulence expressed Wound bed healthy Irrigated with 1% plain lidocaine 5 cc. Repacked with 1/4 inch plain packing Dressing applied  LAB: Culture: Multiple organisms present, none predominant   A/P: 32 y.o. y/o male with improving cellulitis/abscess as above s/p I&D on 04/12/12 -Wound care per above -Continue Bactrim -Pain well controlled -Daily dressing changes -Recheck 48 hours -Consider extending rechecks  Signed, Eula Listen, PA-C 04/17/2012 12:02 PM

## 2012-04-19 ENCOUNTER — Ambulatory Visit (INDEPENDENT_AMBULATORY_CARE_PROVIDER_SITE_OTHER): Payer: 59 | Admitting: Physician Assistant

## 2012-04-19 VITALS — BP 131/83 | HR 95 | Temp 98.9°F | Resp 20 | Ht 69.5 in | Wt 397.0 lb

## 2012-04-19 DIAGNOSIS — L0231 Cutaneous abscess of buttock: Secondary | ICD-10-CM

## 2012-04-19 NOTE — Progress Notes (Signed)
   Patient ID: Bruce Macias MRN: [id number], DOB: 01-13-1981 32 y.o. Date of Encounter: 04/19/2012, 7:05 PM  Primary Physician: Nilda Simmer, MD  Chief Complaint: Wound care   See previous note  HPI: 32 y.o. y/o male presents for wound care s/p I&D on 04/12/12 Doing well No issues or complaints Afebrile/ no chills No nausea or vomiting Tolerating Bactrim Pain resolved Not needing pain medication Daily dressing change Previous note reviewed  History reviewed. No pertinent past medical history.   Home Meds: Prior to Admission medications   Medication Sig Start Date End Date Taking? Authorizing Provider  oxyCODONE-acetaminophen (ROXICET) 5-325 MG per tablet Take 1 tablet by mouth every 4 (four) hours as needed for pain. 04/12/12  No Sherren Mocha, MD  sulfamethoxazole-trimethoprim (BACTRIM DS,SEPTRA DS) 800-160 MG per tablet Take 2 tablets by mouth 2 (two) times daily. 04/12/12  Yes Sherren Mocha, MD    Allergies: No Known Allergies  ROS: Constitutional: Afebrile, no chills Cardiovascular: negative for chest pain or palpitations Dermatological: Positive for wound. Negative for erythema, pain, or warmth  GI: No nausea or vomiting   EXAM: Physical Exam: Blood pressure 131/83, pulse 95, temperature 98.9 F (37.2 C), temperature source Oral, resp. rate 20, height 5' 9.5" (1.765 m), weight 397 lb (180.078 kg), SpO2 95.00%., Body mass index is 57.79 kg/(m^2). General: Well developed, well nourished, in no acute distress. Nontoxic appearing. Head: Normocephalic, atraumatic, sclera non-icteric.  Neck: Supple. Lungs: Breathing is unlabored. Heart: Normal rate. Skin:  Warm and moist. Dressing and packing in place. Continued improving induration without erythema or tenderness to palpation. Neuro: Alert and oriented X 3. Moves all extremities spontaneously. Normal gait.  Psych:  Responds to questions appropriately with a normal affect.   PROCEDURE: Dressing and packing removed. No  purulence expressed Wound bed healthy Irrigated with 1% plain lidocaine 5 cc Repacked with 1/4 inch plain packing Dressing applied  LAB: Culture: Multiple organisms, none predominant   A/P: 32 y.o. y/o male with resolving cellulitis/abscess as above s/p I&D on 04/12/12 -Wound care per above -Continue Bactrim -Pain resolved -Daily dressing changes -Advance packing 1/2 to an inch in 48 hours, follow up 48 hours after  Signed, Eula Listen, PA-C 04/19/2012 7:05 PM

## 2012-04-21 ENCOUNTER — Ambulatory Visit (INDEPENDENT_AMBULATORY_CARE_PROVIDER_SITE_OTHER): Payer: 59 | Admitting: Physician Assistant

## 2012-04-21 VITALS — BP 126/84 | HR 93 | Temp 97.9°F | Resp 20 | Ht 68.25 in | Wt 394.2 lb

## 2012-04-21 DIAGNOSIS — L0231 Cutaneous abscess of buttock: Secondary | ICD-10-CM

## 2012-04-21 NOTE — Progress Notes (Signed)
   Patient ID: Bruce Macias MRN: [id number], DOB: [birth year] 32 y.o. Date of Encounter: 04/21/2012, 6:14 PM  Primary Physician: Nilda Simmer, MD  Chief Complaint: Wound care   See previous note  HPI: 32 y.o. y/o male presents for wound care s/p I&D on 04/12/12 Doing well No issues or complaints Afebrile/ no chills No nausea or vomiting Tolerating Bactrim Pain resolved Not needing any pain medication Beginning to itch Daily dressing change Previous note reviewed  No past medical history on file.   Home Meds: Prior to Admission medications   Medication Sig Start Date End Date Taking? Authorizing Provider  sulfamethoxazole-trimethoprim (BACTRIM DS,SEPTRA DS) 800-160 MG per tablet Take 2 tablets by mouth 2 (two) times daily. 04/12/12  Yes Sherren Mocha, MD  oxyCODONE-acetaminophen (ROXICET) 5-325 MG per tablet Take 1 tablet by mouth every 4 (four) hours as needed for pain. 04/12/12  No Sherren Mocha, MD    Allergies: No Known Allergies  ROS: Constitutional: Afebrile, no chills Cardiovascular: negative for chest pain or palpitations Dermatological: Positive for wound. Negative for erythema, pain, or warmth  GI: No nausea or vomiting   EXAM: Physical Exam: Blood pressure 126/84, pulse 93, temperature 97.9 F (36.6 C), temperature source Oral, resp. rate 20, height 5' 8.25" (1.734 m), weight 394 lb 3.2 oz (178.808 kg), SpO2 97.00%., Body mass index is 59.50 kg/(m^2). General: Well developed, well nourished, in no acute distress. Nontoxic appearing. Head: Normocephalic, atraumatic, sclera non-icteric.  Neck: Supple. Lungs: Breathing is unlabored. Heart: Normal rate. Skin:  Warm and moist. Dressing and packing in place. Continued improving induration without erythema, or tenderness to palpation. Neuro: Alert and oriented X 3. Moves all extremities spontaneously. Normal gait.  Psych:  Responds to questions appropriately with a normal affect.   PROCEDURE: Dressing and packing  removed. No purulence expressed Wound bed healthy Irrigated with 1% plain lidocaine 5 cc. Repacked with 1/4 inch plain packing. Dressing applied  LAB: Culture: Multiple organisms, none predominant   A/P: 32 y.o. y/o male with resolving cellulitis/abscess as above s/p I&D on 04/12/12 -Wound care per above -Continue Bactrim -Pain well controlled -Daily dressing changes -Advance packing in 48 hours, follow up 48 hours later -This was typed in the plan of the last night inadvertently  -Recheck 48 hours  Signed, Eula Listen, PA-C 04/21/2012 6:14 PM

## 2012-05-04 ENCOUNTER — Telehealth: Payer: Self-pay

## 2012-05-04 NOTE — Telephone Encounter (Signed)
Bruce Macias - Please amend DOS 04-13-2012 for this patient.   The chart reflects OV no charge, but [id number] is circled on the charge slip.

## 2012-05-05 NOTE — Telephone Encounter (Signed)
That must have been my fault - it is supposed to be OV no charge so the charge slip should be changed to that.

## 2012-06-14 ENCOUNTER — Encounter: Payer: Self-pay | Admitting: Radiology

## 2012-10-11 ENCOUNTER — Encounter (HOSPITAL_BASED_OUTPATIENT_CLINIC_OR_DEPARTMENT_OTHER): Payer: Self-pay | Admitting: *Deleted

## 2012-10-11 ENCOUNTER — Emergency Department (HOSPITAL_BASED_OUTPATIENT_CLINIC_OR_DEPARTMENT_OTHER)
Admission: EM | Admit: 2012-10-11 | Discharge: 2012-10-11 | Disposition: A | Discharge status: Home / Self Care | Payer: 59 | Attending: Emergency Medicine | Admitting: Emergency Medicine

## 2012-10-11 DIAGNOSIS — R5383 Other fatigue: Secondary | ICD-10-CM | POA: Insufficient documentation

## 2012-10-11 DIAGNOSIS — I10 Essential (primary) hypertension: Secondary | ICD-10-CM | POA: Insufficient documentation

## 2012-10-11 DIAGNOSIS — E669 Obesity, unspecified: Secondary | ICD-10-CM | POA: Insufficient documentation

## 2012-10-11 DIAGNOSIS — R51 Headache: Secondary | ICD-10-CM | POA: Insufficient documentation

## 2012-10-11 DIAGNOSIS — F17200 Nicotine dependence, unspecified, uncomplicated: Secondary | ICD-10-CM | POA: Insufficient documentation

## 2012-10-11 DIAGNOSIS — R5381 Other malaise: Secondary | ICD-10-CM | POA: Insufficient documentation

## 2012-10-11 DIAGNOSIS — R112 Nausea with vomiting, unspecified: Secondary | ICD-10-CM | POA: Insufficient documentation

## 2012-10-11 DIAGNOSIS — R509 Fever, unspecified: Secondary | ICD-10-CM | POA: Insufficient documentation

## 2012-10-11 DIAGNOSIS — R197 Diarrhea, unspecified: Secondary | ICD-10-CM | POA: Insufficient documentation

## 2012-10-11 DIAGNOSIS — R630 Anorexia: Secondary | ICD-10-CM | POA: Insufficient documentation

## 2012-10-11 LAB — URINALYSIS, ROUTINE W REFLEX MICROSCOPIC
Glucose, UA: NEGATIVE mg/dL
Ketones, ur: 15 mg/dL — AB
Protein, ur: >300 mg/dL — AB

## 2012-10-11 LAB — URINE MICROSCOPIC-ADD ON

## 2012-10-11 MED ORDER — SODIUM CHLORIDE 0.9 % IV BOLUS (SEPSIS)
1000.0000 mL | Freq: Once | INTRAVENOUS | Status: AC
  Administered 2012-10-11: 1000 mL via INTRAVENOUS

## 2012-10-11 MED ORDER — LISINOPRIL 10 MG PO TABS: 10.0000 mg | ORAL_TABLET | Freq: Every day | ORAL | Status: DC

## 2012-10-11 MED ORDER — ONDANSETRON HCL 4 MG/2ML IJ SOLN
4.0000 mg | Freq: Once | INTRAMUSCULAR | Status: AC
  Administered 2012-10-11: 4 mg via INTRAVENOUS
  Filled 2012-10-11: qty 2

## 2012-10-11 MED ORDER — ONDANSETRON 8 MG PO TBDP: 8.0000 mg | ORAL_TABLET | Freq: Three times a day (TID) | ORAL | Status: DC | PRN

## 2012-10-11 MED ORDER — DICYCLOMINE HCL 10 MG PO CAPS
10.0000 mg | ORAL_CAPSULE | Freq: Once | ORAL | Status: AC
  Administered 2012-10-11: 10 mg via ORAL
  Filled 2012-10-11: qty 1

## 2012-10-11 MED ORDER — DICYCLOMINE HCL 10 MG PO CAPS: 10.0000 mg | ORAL_CAPSULE | Freq: Three times a day (TID) | ORAL | Status: DC | PRN

## 2012-10-11 NOTE — ED Provider Notes (Signed)
History    CSN: [id number] Arrival date & time 10/11/12  1426  First MD Initiated Contact with Patient 10/11/12 1500     Chief Complaint  Patient presents with  . Abdominal Pain   (Consider location/radiation/quality/duration/timing/severity/associated sxs/prior Treatment) Patient is a 32 y.o. male presenting with abdominal pain. The history is provided by the patient.  Abdominal Pain This is a new problem. The current episode started more than 2 days ago. Associated symptoms include abdominal pain. Pertinent negatives include no chest pain, no headaches and no shortness of breath.   patient's had nausea vomiting and diarrhea for the last few days. His niece had similar symptoms but she is feeling better already. He states he gets episodes of crampy abdominal pain. He has had chills and fevers and decreased appetite. No blood in stool or emesis. He has had a headache. He states he feels bad. He states he was sweating yesterday. He has a history of hypertension but has been out of his medications recently. States he does not have a doctor to follow.  History reviewed. No pertinent past medical history. History reviewed. No pertinent past surgical history. Family History  Problem Relation Age of Onset  . Cancer Father    History  Substance Use Topics  . Smoking status: Current Some Day Smoker -- 0.50 packs/day    Types: Cigarettes  . Smokeless tobacco: Not on file  . Alcohol Use: No    Review of Systems  Constitutional: Positive for fever, chills, appetite change and fatigue. Negative for activity change.  HENT: Negative for neck stiffness.   Eyes: Negative for pain.  Respiratory: Negative for chest tightness and shortness of breath.   Cardiovascular: Negative for chest pain and leg swelling.  Gastrointestinal: Positive for nausea, vomiting, abdominal pain and diarrhea.  Genitourinary: Negative for flank pain.  Musculoskeletal: Negative for back pain.  Skin: Negative for rash.   Neurological: Negative for weakness, numbness and headaches.  Psychiatric/Behavioral: Negative for behavioral problems.    Allergies  Review of patient's allergies indicates no known allergies.  Home Medications   Current Outpatient Rx  Name  Route  Sig  Dispense  Refill  . dicyclomine (BENTYL) 10 MG capsule   Oral   Take 1 capsule (10 mg total) by mouth 3 (three) times daily as needed.   10 capsule   0   . ondansetron (ZOFRAN-ODT) 8 MG disintegrating tablet   Oral   Take 1 tablet (8 mg total) by mouth every 8 (eight) hours as needed for nausea.   10 tablet   0    BP 140/100  Pulse 100  Temp(Src) 98.5 F (36.9 C) (Oral)  Resp 16  Ht 5\' 9"  (1.753 m)  Wt 340 lb (154.223 kg)  BMI 50.19 kg/m2  SpO2 94% Physical Exam  Nursing note and vitals reviewed. Constitutional: He is oriented to person, place, and time. He appears well-developed and well-nourished.  Patient is obese  HENT:  Head: Normocephalic and atraumatic.  Eyes: EOM are normal. Pupils are equal, round, and reactive to light.  Neck: Normal range of motion. Neck supple.  Cardiovascular: Normal rate, regular rhythm and normal heart sounds.   No murmur heard. Pulmonary/Chest: Effort normal and breath sounds normal.  Abdominal: Soft. Bowel sounds are normal. He exhibits no distension and no mass. There is tenderness. There is no rebound and no guarding.  Minimal abdominal tenderness.  Musculoskeletal: Normal range of motion. He exhibits no edema.  Neurological: He is alert and oriented to person,  place, and time. No cranial nerve deficit.  Skin: Skin is warm and dry.  Psychiatric: He has a normal mood and affect.    ED Course  Procedures (including critical care time) Labs Reviewed  URINALYSIS, ROUTINE W REFLEX MICROSCOPIC - Abnormal; Notable for the following:    Color, Urine AMBER (*)    APPearance CLOUDY (*)    Specific Gravity, Urine 1.034 (*)    Hgb urine dipstick LARGE (*)    Bilirubin Urine SMALL  (*)    Ketones, ur 15 (*)    Protein, ur >300 (*)    All other components within normal limits  URINE MICROSCOPIC-ADD ON - Abnormal; Notable for the following:    Bacteria, UA FEW (*)    Casts HYALINE CASTS (*)    All other components within normal limits  GLUCOSE, CAPILLARY - Abnormal; Notable for the following:    Glucose-Capillary 125 (*)    All other components within normal limits  URINE CULTURE   No results found. 1. Nausea vomiting and diarrhea     MDM  Patient nausea vomiting diarrhea. Urinalysis shows some mild dehydration. Culture sent for some pyuria. He is not symptomatic of a UTI. Patient feels better after IV fluids and his tolerate orals. He felt better after Bentyl. He will be discharged home  Juliet Rude. Rubin Payor, MD 10/11/12 1745

## 2012-10-11 NOTE — ED Notes (Signed)
Pt c/o lower abd pain, n/d  x 4 days denies urinary symptoms.  Other family members at home wit same

## 2012-10-11 NOTE — ED Notes (Signed)
Ginger Ale given per pt preference.

## 2012-10-11 NOTE — ED Notes (Signed)
MD at bedside giving test results and plan of care for discharge.  Family at bedside.

## 2012-10-12 LAB — URINE CULTURE

## 2014-04-08 ENCOUNTER — Ambulatory Visit (INDEPENDENT_AMBULATORY_CARE_PROVIDER_SITE_OTHER): Payer: 59

## 2014-04-08 ENCOUNTER — Ambulatory Visit (INDEPENDENT_AMBULATORY_CARE_PROVIDER_SITE_OTHER): Payer: 59 | Admitting: Internal Medicine

## 2014-04-08 VITALS — BP 134/82 | HR 121 | Temp 98.9°F | Resp 18 | Ht 70.0 in | Wt 393.0 lb

## 2014-04-08 DIAGNOSIS — R05 Cough: Secondary | ICD-10-CM

## 2014-04-08 DIAGNOSIS — L0591 Pilonidal cyst without abscess: Secondary | ICD-10-CM

## 2014-04-08 DIAGNOSIS — IMO0001 Reserved for inherently not codable concepts without codable children: Secondary | ICD-10-CM

## 2014-04-08 DIAGNOSIS — R509 Fever, unspecified: Secondary | ICD-10-CM

## 2014-04-08 DIAGNOSIS — R059 Cough, unspecified: Secondary | ICD-10-CM

## 2014-04-08 DIAGNOSIS — R000 Tachycardia, unspecified: Secondary | ICD-10-CM

## 2014-04-08 DIAGNOSIS — R61 Generalized hyperhidrosis: Secondary | ICD-10-CM

## 2014-04-08 DIAGNOSIS — R350 Frequency of micturition: Secondary | ICD-10-CM

## 2014-04-08 LAB — COMPREHENSIVE METABOLIC PANEL

## 2014-04-08 LAB — POCT URINALYSIS DIPSTICK
Glucose, UA: NEGATIVE
Ketones, UA: 40
LEUKOCYTES UA: NEGATIVE
NITRITE UA: NEGATIVE
PH UA: 5.5
SPEC GRAV UA: 1.025
UROBILINOGEN UA: 4

## 2014-04-08 LAB — POCT CBC
Granulocyte percent: 92.3 %G — AB (ref 37–80)
HCT, POC: 49.4 % (ref 43.5–53.7)
Hemoglobin: 16.2 g/dL (ref 14.1–18.1)
Lymph, poc: 0.8 (ref 0.6–3.4)
MCH, POC: 30.7 pg (ref 27–31.2)
MCHC: 32.8 g/dL (ref 31.8–35.4)
MCV: 93.4 fL (ref 80–97)
MID (CBC): 0.3 (ref 0–0.9)
MPV: 6.5 fL (ref 0–99.8)
PLATELET COUNT, POC: 314 10*3/uL (ref 142–424)
POC Granulocyte: 13.4 — AB (ref 2–6.9)
POC LYMPH PERCENT: 5.6 %L — AB (ref 10–50)
POC MID %: 2.1 %M (ref 0–12)
RBC: 5.29 M/uL (ref 4.69–6.13)
RDW, POC: 13.4 %
WBC: 14.5 10*3/uL — AB (ref 4.6–10.2)

## 2014-04-08 LAB — GLUCOSE, POCT (MANUAL RESULT ENTRY): POC GLUCOSE: 138 mg/dL — AB (ref 70–99)

## 2014-04-08 LAB — POCT UA - MICROSCOPIC ONLY
Crystals, Ur, HPF, POC: NEGATIVE
MUCUS UA: POSITIVE
RENAL TUBULAR CELLS: POSITIVE
WBC, Ur, HPF, POC: NEGATIVE
Yeast, UA: NEGATIVE

## 2014-04-08 LAB — TSH: TSH: 1.8 u[IU]/mL (ref 0.350–4.500)

## 2014-04-08 LAB — POCT GLYCOSYLATED HEMOGLOBIN (HGB A1C): HEMOGLOBIN A1C: 6.5

## 2014-04-08 LAB — LIPASE

## 2014-04-08 MED ORDER — CEFTRIAXONE SODIUM 1 G IJ SOLR
1.0000 g | Freq: Once | INTRAMUSCULAR | Status: AC
  Administered 2014-04-08: 1 g via INTRAMUSCULAR

## 2014-04-08 MED ORDER — DOXYCYCLINE HYCLATE 100 MG PO TABS: 100.0000 mg | ORAL_TABLET | Freq: Two times a day (BID) | ORAL | Status: DC

## 2014-04-08 MED ORDER — IBUPROFEN 600 MG PO TABS: 600.0000 mg | ORAL_TABLET | Freq: Three times a day (TID) | ORAL | Status: DC | PRN

## 2014-04-08 MED ORDER — METHOCARBAMOL 750 MG PO TABS: 750.0000 mg | ORAL_TABLET | Freq: Four times a day (QID) | ORAL | Status: DC

## 2014-04-08 NOTE — Patient Instructions (Addendum)
Fever, Adult °A fever is a higher than normal body temperature. In an adult, an oral temperature around 98.6° F (37° C) is considered normal. A temperature of 100.4° F (38° C) or higher is generally considered a fever. Mild or moderate fevers generally have no long-term effects and often do not require treatment. Extreme fever (greater than or equal to 106° F or 41.1° C) can cause seizures. The sweating that may occur with repeated or prolonged fever may cause dehydration. Elderly people can develop confusion during a fever. °A measured temperature can vary with: °· Age. °· Time of day. °· Method of measurement (mouth, underarm, rectal, or ear). °The fever is confirmed by taking a temperature with a thermometer. Temperatures can be taken different ways. Some methods are accurate and some are not. °· An oral temperature is used most commonly. Electronic thermometers are fast and accurate. °· An ear temperature will only be accurate if the thermometer is positioned as recommended by the manufacturer. °· A rectal temperature is accurate and done for those adults who have a condition where an oral temperature cannot be taken. °· An underarm (axillary) temperature is not accurate and not recommended. °Fever is a symptom, not a disease.  °CAUSES  °· Infections commonly cause fever. °· Some noninfectious causes for fever include: °· Some arthritis conditions. °· Some thyroid or adrenal gland conditions. °· Some immune system conditions. °· Some types of cancer. °· A medicine reaction. °· High doses of certain street drugs such as methamphetamine. °· Dehydration. °· Exposure to high outside or room temperatures. °· Occasionally, the source of a fever cannot be determined. This is sometimes called a "fever of unknown origin" (FUO). °· Some situations may lead to a temporary rise in body temperature that may go away on its own. Examples are: °· Childbirth. °· Surgery. °· Intense exercise. °HOME CARE INSTRUCTIONS  °· Take  appropriate medicines for fever. Follow dosing instructions carefully. If you use acetaminophen to reduce the fever, be careful to avoid taking other medicines that also contain acetaminophen. Do not take aspirin for a fever if you are younger than age 19. There is an association with Reye's syndrome. Reye's syndrome is a rare but potentially deadly disease. °· If an infection is present and antibiotics have been prescribed, take them as directed. Finish them even if you start to feel better. °· Rest as needed. °· Maintain an adequate fluid intake. To prevent dehydration during an illness with prolonged or recurrent fever, you may need to drink extra fluid. Drink enough fluids to keep your urine clear or pale yellow. °· Sponging or bathing with room temperature water may help reduce body temperature. Do not use ice water or alcohol sponge baths. °· Dress comfortably, but do not over-bundle. °SEEK MEDICAL CARE IF:  °· You are unable to keep fluids down. °· You develop vomiting or diarrhea. °· You are not feeling at least partly better after 3 days. °· You develop new symptoms or problems. °SEEK IMMEDIATE MEDICAL CARE IF:  °· You have shortness of breath or trouble breathing. °· You develop excessive weakness. °· You are dizzy or you faint. °· You are extremely thirsty or you are making little or no urine. °· You develop new pain that was not there before (such as in the head, neck, chest, back, or abdomen). °· You have persistent vomiting and diarrhea for more than 1 to 2 days. °· You develop a stiff neck or your eyes become sensitive to light. °· You develop a   skin rash.  You have a fever or persistent symptoms for more than 2 to 3 days.  You have a fever and your symptoms suddenly get worse. MAKE SURE YOU:   Understand these instructions.  Will watch your condition.  Will get help right away if you are not doing well or get worse. Document Released: 09/08/2000 Document Revised: 07/30/2013 Document  Reviewed: 01/14/2011 [facility] Patient Information 2015 [place], [place]. This information is not intended to replace advice given to you by your health care provider. Make sure you discuss any questions you have with your health care provider. [place] Spotted Fever [place] Spotted Fever (RMSF) is the oldest known tick-borne disease of people in the [place]. This disease was named because it was first described among people in the [facility] area who had an illness characterized by a rash with red-purple-black spots. This disease is caused by a rickettsia (Rickettsia rickettsii), a bacteria carried by the tick. The [facility] wood tick and the American dog tick acquire and transmit the RMSF bacteria (pictures NOT actual size). When a larval, nymphal, or adult tick feeds on an infected rodent or larger animal, the tick can become infected. Infected adult ticks then feed on people who may then get RMSF. The tick transmits the disease to humans during a prolonged period of feeding that lasts many hours, days, or even a couple weeks. The bite is painless and frequently goes unnoticed. An infected male tick may also pass the rickettsial bacteria to her eggs that then may mature to be infected adult ticks. The rickettsia that causes RMSF can also get into a person's body through damaged skin. A tick bite is not necessary. People can get RMSF if they crush a tick and get its blood or body fluids on their skin through a small cut or sore.  DIAGNOSIS Diagnosis is made by laboratory tests.  TREATMENT Treatment is with antibiotics (medications that kill rickettsia and other bacteria). Immediate treatment usually prevents death. GEOGRAPHIC RANGE This disease was reported only in the [facility] until 1931. RMSF has more recently been described among individuals in all states except [place], [place], and [place]. The highest reported incidences of RMSF now occur among residents of [place],  [place], [place], and the [place]. TIME OF YEAR  Most cases are diagnosed during late spring and summer when ticks are most active. However, especially in the warmer [place] states, a few cases occur during the winter. SYMPTOMS   Symptoms of RMSF begin from 2 to 14 days after a tick bite. The most common early symptoms are fever, muscle aches, and headache followed by nausea (feeling sick to your stomach) or vomiting.  The RMSF rash is typically delayed until 3 or more days after symptom onset, and eventually develops in 9 of 10 infected patients by the fifth day of illness. If the disease is not treated it can cause death. If you get a fever, headache, muscle aches, rash, nausea, or vomiting within 2 weeks of a possible tick bite or exposure, you should see your caregiver immediately. PREVENTION Ticks prefer to hide in shady, moist ground litter. They can often be found above the ground clinging to tall grass, brush, shrubs and low tree branches. They also inhabit lawns and gardens, especially at the edges of woodlands and around old stone walls. Within the areas where ticks generally live, no naturally vegetated area can be considered completely free of infected ticks. The best precaution against RMSF is to avoid contact with soil,  leaf litter, and vegetation as much as possible in tick-infested areas. For those who enjoy gardening or walking in their yards, clear brush and mow tall grass around houses and at the edges of gardens. This may help reduce the tick population in the immediate area. Applications of chemical insecticides by a licensed professional in the spring (late May) and fall (September) will also control ticks, especially in heavily infested areas. Treatment will never get rid of all the ticks. Getting rid of small animal populations that host ticks will also decrease the tick population. When working in the garden, Mattelpruning shrubs, or handling soil and vegetation, wear light-colored  protective clothing and gloves. Spot-check often to prevent ticks from reaching the skin. Ticks cannot jump or fly. They will not drop from an above-ground perch onto a passing animal. Once a tick gains access to human skin it climbs upward until it reaches a more protected area. For example, the back of the knee, groin, navel, armpit, ears, or nape of the neck. It then begins the slow process of embedding itself in the skin. Campers, hikers, field workers, and others who spend time in wooded, brushy, or tall grassy areas can avoid exposure to ticks by using the following precautions:  Wear light-colored clothing with a tight weave to spot ticks more easily and prevent contact with the skin.  Wear long pants tucked into socks, long-sleeved shirts tucked into pants and enclosed shoes or boots along with insect repellent.  Spray clothes with insect repellent containing either DEET or Permethrin. Only DEET can be used on exposed skin. Follow the manufacturer's directions carefully.  Wear a hat and keep long hair pulled back.  Stay on cleared, well-worn trails whenever possible.  Spot-check yourself and others often for the presence of ticks on clothes. If you find one, there are likely to be others. Check thoroughly.  Remove clothes after leaving tick-infested areas. If possible, wash them to eliminate any unseen ticks. Check yourself, your children and any pets from head to toe for the presence of ticks.  Shower and shampoo. You can greatly reduce your chances of contracting RMSF if you remove attached ticks as soon as possible. Regular checks of the body, including all body sites covered by hair (head, armpits, genitals), allow removal of the tick before rickettsial transmission. To remove an attached tick, use a forceps or tweezers to detach the intact tick without leaving mouth parts in the skin. The tick bite wound should be cleansed after tick removal. Remember the most common symptoms of RMSF  are fever, muscle aches, headache, and nausea or vomiting with a later onset of rash. If you get these symptoms after a tick bite and while living in an area where RMSF is found, RMSF should be suspected. If the disease is not treated, it can cause death. See your caregiver immediately if you get these symptoms. Do this even if not aware of a tick bite. Document Released: 06/27/2000 Document Revised: 07/30/2013 Document Reviewed: 02/17/2009 [facility] Patient Information 2015 [place], [place]. This information is not intended to replace advice given to you by your health care provider. Make sure you discuss any questions you have with your health care provider.

## 2014-04-08 NOTE — Progress Notes (Signed)
Subjective:    Patient ID: Bruce Macias, male    DOB: 1980-07-17, 34 y.o.   MRN: [id number]  HPI 61lb male has had fever and sweats for 3-4 days. On no medication but has hx of HTN. No hx of diabetes. No exposure hx, no cough or congestion by hx, no nausea or diarrhea or abdominal pain, No dysuria or hematuria but has polydipsia and polyuria, fever only low grade   Review of Systems     Objective:   Physical Exam  Constitutional: He appears well-nourished. No distress.  HENT:  Head: Normocephalic.  Right Ear: External ear normal.  Left Ear: External ear normal.  Nose: Nose normal.  Mouth/Throat: Oropharynx is clear and moist.  Eyes: Conjunctivae and EOM are normal. Pupils are equal, round, and reactive to light.  Neck: Normal range of motion. Neck supple.  Cardiovascular: Regular rhythm and normal heart sounds.   No extrasystoles are present. Tachycardia present.  Exam reveals no gallop.   No murmur heard. Pulmonary/Chest: Effort normal. No tachypnea. No respiratory distress. He has decreased breath sounds. He has no wheezes. He has rhonchi. He has no rales.  Lymphadenopathy:    He has no cervical adenopathy.  Skin: Skin is intact. No rash noted.     Fluctuant non tender PNC, no erythema   132/102  120pulse 93%oximetry   [facility] reading (PRIMARY) by  Dr Perrin Maltese no definite infiltrate. Results for orders placed or performed in visit on 04/08/14  POCT CBC  Result Value Ref Range   WBC 14.5 (A) 4.6 - 10.2 K/uL   Lymph, poc 0.8 0.6 - 3.4   POC LYMPH PERCENT 5.6 (A) 10 - 50 %L   MID (cbc) 0.3 0 - 0.9   POC MID % 2.1 0 - 12 %M   POC Granulocyte 13.4 (A) 2 - 6.9   Granulocyte percent 92.3 (A) 37 - 80 %G   RBC 5.29 4.69 - 6.13 M/uL   Hemoglobin 16.2 14.1 - 18.1 g/dL   HCT, POC 40.9 81.1 - 53.7 %   MCV 93.4 80 - 97 fL   MCH, POC 30.7 27 - 31.2 pg   MCHC 32.8 31.8 - 35.4 g/dL   RDW, POC 91.4 %   Platelet Count, POC 314 142 - 424 K/uL   MPV 6.5 0 - 99.8 fL  POCT glucose  (manual entry)  Result Value Ref Range   POC Glucose 138 (A) 70 - 99 mg/dl  POCT glycosylated hemoglobin (Hb A1C)  Result Value Ref Range   Hemoglobin A1C 6.5   POCT urinalysis dipstick  Result Value Ref Range   Color, UA Orange    Clarity, UA Clear    Glucose, UA Neg    Bilirubin, UA large    Ketones, UA 40    Spec Grav, UA 1.025    Blood, UA mod    pH, UA 5.5    Protein, UA >=300    Urobilinogen, UA 4.0    Nitrite, UA neg    Leukocytes, UA Negative   POCT UA - Microscopic Only  Result Value Ref Range   WBC, Ur, HPF, POC Neg    RBC, urine, microscopic 0-10 clumps    Bacteria, U Microscopic 1+    Mucus, UA pos    Epithelial cells, urine per micros 2-4    Crystals, Ur, HPF, POC neg    Casts, Ur, LPF, POC waxy    Yeast, UA neg    Renal tubular cells pos  Assessment & Plan:  [facility] Fever/Sweats cause unclear but probably UTI Neck ache probably wry neck/neg kernig and brudzinski/No HA/No pain walking Rocephin 1G/Doxycycline 100mg  BID Robaxin for the neck

## 2014-04-09 ENCOUNTER — Ambulatory Visit (INDEPENDENT_AMBULATORY_CARE_PROVIDER_SITE_OTHER): Payer: 59 | Admitting: Internal Medicine

## 2014-04-09 VITALS — BP 130/78 | HR 110 | Temp 98.2°F | Resp 18 | Ht 70.0 in | Wt 393.0 lb

## 2014-04-09 DIAGNOSIS — M791 Myalgia, unspecified site: Secondary | ICD-10-CM

## 2014-04-09 DIAGNOSIS — R509 Fever, unspecified: Secondary | ICD-10-CM

## 2014-04-09 LAB — URINE CULTURE
Colony Count: NO GROWTH
Organism ID, Bacteria: NO GROWTH

## 2014-04-09 MED ORDER — CEFTRIAXONE SODIUM 1 G IJ SOLR
1.0000 g | Freq: Once | INTRAMUSCULAR | Status: AC
  Administered 2014-04-09: 1 g via INTRAMUSCULAR

## 2014-04-09 NOTE — Progress Notes (Signed)
   Subjective:    Patient ID: Bruce Macias, male    DOB: 1980/07/21, 34 y.o.   MRN: [id number]  HPI Feels much better, antibiotics are working. All labs reviewed, urine culture no growth. RMSF pending.    Review of Systems     Objective:   Physical Exam  Constitutional: He is oriented to person, place, and time. He appears well-nourished. No distress.  HENT:  Head: Normocephalic.  Right Ear: External ear normal.  Left Ear: External ear normal.  Nose: Nose normal.  Mouth/Throat: Oropharynx is clear and moist.  Eyes: Conjunctivae and EOM are normal. Pupils are equal, round, and reactive to light. No scleral icterus.  Neck: Normal range of motion. Neck supple.  Cardiovascular: Regular rhythm and normal heart sounds.  Tachycardia present.   Pulmonary/Chest: Effort normal and breath sounds normal.  Abdominal: Soft. Bowel sounds are normal.  Musculoskeletal: Normal range of motion.  Lymphadenopathy:    He has no cervical adenopathy.  Neurological: He is alert and oriented to person, place, and time. He exhibits normal muscle tone. Coordination normal.  Skin: No rash noted.  Psychiatric: He has a normal mood and affect.          Assessment & Plan:  Rocephin 1 G Finish doxycycline RTC if not well 2-3 days.

## 2014-04-10 ENCOUNTER — Ambulatory Visit (INDEPENDENT_AMBULATORY_CARE_PROVIDER_SITE_OTHER): Payer: 59 | Admitting: Family Medicine

## 2014-04-10 ENCOUNTER — Other Ambulatory Visit: Payer: Self-pay

## 2014-04-10 ENCOUNTER — Emergency Department (HOSPITAL_COMMUNITY)
Admission: EM | Admit: 2014-04-10 | Discharge: 2014-04-10 | Disposition: A | Discharge status: Home / Self Care | Payer: 59 | Attending: Emergency Medicine | Admitting: Emergency Medicine

## 2014-04-10 ENCOUNTER — Emergency Department (HOSPITAL_COMMUNITY): Payer: 59

## 2014-04-10 ENCOUNTER — Encounter (HOSPITAL_COMMUNITY): Payer: Self-pay | Admitting: Family Medicine

## 2014-04-10 VITALS — BP 118/82 | HR 110 | Temp 98.9°F | Resp 18 | Ht 70.0 in | Wt 393.0 lb

## 2014-04-10 DIAGNOSIS — Z720 Tobacco use: Secondary | ICD-10-CM | POA: Diagnosis not present

## 2014-04-10 DIAGNOSIS — Z792 Long term (current) use of antibiotics: Secondary | ICD-10-CM | POA: Insufficient documentation

## 2014-04-10 DIAGNOSIS — I10 Essential (primary) hypertension: Secondary | ICD-10-CM | POA: Insufficient documentation

## 2014-04-10 DIAGNOSIS — R091 Pleurisy: Secondary | ICD-10-CM | POA: Diagnosis present

## 2014-04-10 DIAGNOSIS — M542 Cervicalgia: Secondary | ICD-10-CM | POA: Diagnosis not present

## 2014-04-10 DIAGNOSIS — R000 Tachycardia, unspecified: Secondary | ICD-10-CM | POA: Diagnosis not present

## 2014-04-10 DIAGNOSIS — R0789 Other chest pain: Secondary | ICD-10-CM | POA: Insufficient documentation

## 2014-04-10 DIAGNOSIS — R0782 Intercostal pain: Secondary | ICD-10-CM

## 2014-04-10 DIAGNOSIS — Z79899 Other long term (current) drug therapy: Secondary | ICD-10-CM | POA: Diagnosis not present

## 2014-04-10 LAB — I-STAT TROPONIN, ED: Troponin i, poc: 0 ng/mL (ref 0.00–0.08)

## 2014-04-10 LAB — CBC
HEMATOCRIT: 46.6 % (ref 39.0–52.0)
Hemoglobin: 15.9 g/dL (ref 13.0–17.0)
MCH: 31.7 pg (ref 26.0–34.0)
MCHC: 34.1 g/dL (ref 30.0–36.0)
MCV: 92.8 fL (ref 78.0–100.0)
Platelets: 340 10*3/uL (ref 150–400)
RBC: 5.02 MIL/uL (ref 4.22–5.81)
RDW: 13.2 % (ref 11.5–15.5)
WBC: 10.9 10*3/uL — AB (ref 4.0–10.5)

## 2014-04-10 LAB — BASIC METABOLIC PANEL
Anion gap: 11 (ref 5–15)
BUN: 10 mg/dL (ref 6–23)
CO2: 28 mmol/L (ref 19–32)
Calcium: 9.4 mg/dL (ref 8.4–10.5)
Chloride: 98 mEq/L (ref 96–112)
Creatinine, Ser: 1.06 mg/dL (ref 0.50–1.35)
GFR calc Af Amer: >90 mL/min (ref >90)
GFR, EST NON AFRICAN AMERICAN: 89 mL/min — AB (ref >90)
GLUCOSE: 114 mg/dL — AB (ref 70–99)
POTASSIUM: 3.6 mmol/L (ref 3.5–5.1)
SODIUM: 137 mmol/L (ref 135–145)

## 2014-04-10 LAB — ROCKY MTN SPOTTED FVR AB, IGM-BLOOD: ROCKY MTN SPOTTED FEVER, IGM: 0.62 IV

## 2014-04-10 LAB — D-DIMER, QUANTITATIVE: D-Dimer, Quant: 1.08 ug/mL-FEU — ABNORMAL HIGH (ref 0.00–0.48)

## 2014-04-10 MED ORDER — ASPIRIN 81 MG PO CHEW: 324.0000 mg | CHEWABLE_TABLET | Freq: Once | ORAL | Status: DC

## 2014-04-10 MED ORDER — TRAMADOL HCL 50 MG PO TABS: 50.0000 mg | ORAL_TABLET | Freq: Four times a day (QID) | ORAL | Status: DC | PRN

## 2014-04-10 MED ORDER — IPRATROPIUM-ALBUTEROL 0.5-2.5 (3) MG/3ML IN SOLN
3.0000 mL | Freq: Once | RESPIRATORY_TRACT | Status: AC
  Administered 2014-04-10: 3 mL via RESPIRATORY_TRACT
  Filled 2014-04-10: qty 3

## 2014-04-10 MED ORDER — DIAZEPAM 5 MG PO TABS
5.0000 mg | ORAL_TABLET | Freq: Once | ORAL | Status: AC
  Administered 2014-04-10: 5 mg via ORAL
  Filled 2014-04-10: qty 1

## 2014-04-10 MED ORDER — IOHEXOL 350 MG/ML SOLN
100.0000 mL | Freq: Once | INTRAVENOUS | Status: AC | PRN
  Administered 2014-04-10: 80 mL via INTRAVENOUS

## 2014-04-10 MED ORDER — HYDROCODONE-ACETAMINOPHEN 5-325 MG PO TABS
1.0000 | ORAL_TABLET | Freq: Once | ORAL | Status: AC
  Administered 2014-04-10: 1 via ORAL
  Filled 2014-04-10: qty 1

## 2014-04-10 NOTE — ED Notes (Addendum)
Pt presents from [facility] via [facility] with c/o chest wall pain.  PT reports was prescribed rocephin for a cough/fever two days ago and began having sharp pains to chest when "turning left and right".  PT denies pain at rest.  Pt denies SOB or NVD. PT is A&Ox4 and in NAD. Pt given 325mg  ASA by [facility].

## 2014-04-10 NOTE — ED Provider Notes (Signed)
CSN: [id number]     Arrival date & time 04/10/14  1519 History   First MD Initiated Contact with Patient 04/10/14 1617     Chief Complaint  Patient presents with  . Pleurisy     (Consider location/radiation/quality/duration/timing/severity/associated sxs/prior Treatment) HPI  Pt is a morbidly obese 34yo male with hx of HTN and current daily smoker, presenting to ED from [facility] via [facility] with c/o right sided anterior chest wall pain that started yesterday. Today, pt was given  aspirin PTA as pt had an EKG performed, UC was concerned for possible ST depressions on EKG.  Pt reports hx of hot and cold chills this weekend with bilateral neck pain and stiffness which has since resolved but states he is now having right sided chest pain.  Pt states "I feel like I pulled a muscle." pain is aching and sharp, worse with movement and palpation.  Pain has been constant since onset yesterday, 6/10 at worst, resolves when lying still. Denies falls or heavy lifting.  Denies difficulty breathing or palpitations. Denies hx of CAD or asthma. Denies hx of PE or DVT. No leg pain or swelling. Denies n/v/d. No recent travel or sick contacts. States he was given a muscle relaxer, robaxin at UC as well as ibuprofen which provided moderate relief.    Past Medical History  Diagnosis Date  . Hypertension    History reviewed. No pertinent past surgical history. Family History  Problem Relation Age of Onset  . Cancer Father    History  Substance Use Topics  . Smoking status: Current Some Day Smoker -- 0.33 packs/day    Types: Cigarettes  . Smokeless tobacco: Not on file  . Alcohol Use: No    Review of Systems  Constitutional: Positive for fever and chills. Negative for appetite change.  HENT: Positive for congestion. Negative for sore throat.   Respiratory: Positive for cough. Negative for shortness of breath.   Cardiovascular: Positive for chest pain ( right side, anterior). Negative for palpitations  and leg swelling.  Gastrointestinal: Negative for nausea, vomiting, abdominal pain and diarrhea.  Musculoskeletal: Positive for myalgias and neck pain. Negative for back pain and neck stiffness.  All other systems reviewed and are negative.     Allergies  Review of patient's allergies indicates no known allergies.  Home Medications   Prior to Admission medications   Medication Sig Start Date End Date Taking? Authorizing Provider  doxycycline (VIBRA-TABS) 100 MG tablet Take 1 tablet (100 mg total) by mouth 2 (two) times daily. 04/08/14  Yes Jonita Albee, MD  Menthol-Methyl Salicylate (MUSCLE RUB) 10-15 % CREA Apply 1 application topically as needed for muscle pain.   Yes Historical Provider, MD  methocarbamol (ROBAXIN-750) 750 MG tablet Take 1 tablet (750 mg total) by mouth 4 (four) times daily. 04/08/14  Yes Jonita Albee, MD  Multiple Vitamin (MULTIVITAMIN WITH MINERALS) TABS tablet Take 1 tablet by mouth daily.   Yes Historical Provider, MD  Phenylephrine-Pheniramine-DM 01-16-19 MG PACK Take 1 Package by mouth daily as needed (FOR COLD).   Yes Historical Provider, MD  Soft Lens Products (REWETTING DROPS) SOLN 1 drop by Does not apply route daily as needed (FOR CONTACTS).   Yes Historical Provider, MD  ibuprofen (ADVIL,MOTRIN) 600 MG tablet Take 1 tablet (600 mg total) by mouth every 8 (eight) hours as needed. 04/08/14   Jonita Albee, MD  lisinopril (PRINIVIL,ZESTRIL) 10 MG tablet Take 1 tablet (10 mg total) by mouth daily. Patient not taking: Reported  on 04/09/2014 10/11/12   Juliet Rude. Pickering, MD  traMADol (ULTRAM) 50 MG tablet Take 1 tablet (50 mg total) by mouth every 6 (six) hours as needed. 04/10/14   Junius Finner, PA-C   BP 132/68 mmHg  Pulse 98  Temp(Src) 99.1 F (37.3 C) (Oral)  Resp 20  SpO2 100% Physical Exam  Constitutional: He appears well-developed and well-nourished.  Morbidly obese male lying in exam bed, NAD.  HENT:  Head: Normocephalic and atraumatic.  Eyes:  Conjunctivae are normal. No scleral icterus.  Neck: Normal range of motion. Neck supple.  No nuchal rigidity or meningeal signs.  Cardiovascular: Normal rate, regular rhythm and normal heart sounds.   Mildly tachycardic in triage, no tachycardia on exam.  Pulmonary/Chest: Effort normal. No respiratory distress. He has wheezes. He has no rales. He exhibits tenderness ( right anterior chest wall w/o crepitus).  No respiratory distress, able to speak in full sentences w/o difficulty. Lungs: faint, diffuse expiratory wheeze. No rhonchi.  Abdominal: Soft. Bowel sounds are normal. He exhibits no distension and no mass. There is no tenderness. There is no rebound and no guarding.  Musculoskeletal: Normal range of motion.  Neurological: He is alert.  Skin: Skin is warm and dry.  Nursing note and vitals reviewed.   ED Course  Procedures (including critical care time) Labs Review Labs Reviewed  CBC - Abnormal; Notable for the following:    WBC 10.9 (*)    All other components within normal limits  BASIC METABOLIC PANEL - Abnormal; Notable for the following:    Glucose, Bld 114 (*)    GFR calc non Af Amer 89 (*)    All other components within normal limits  D-DIMER, QUANTITATIVE - Abnormal; Notable for the following:    D-Dimer, Quant 1.08 (*)    All other components within normal limits  I-STAT TROPOININ, ED    Imaging Review Ct Angio Chest Pe W/cm &/or Wo Cm  04/10/2014   CLINICAL DATA:  Chest wall pain. Recent antibiotic therapy for cough and fever. Evaluate for pulmonary embolism. Initial encounter.  EXAM: CT ANGIOGRAPHY CHEST WITH CONTRAST  TECHNIQUE: Multidetector CT imaging of the chest was performed using the standard protocol during bolus administration of intravenous contrast. Multiplanar CT image reconstructions and MIPs were obtained to evaluate the vascular anatomy.  CONTRAST:  80mL OMNIPAQUE IOHEXOL 350 MG/ML SOLN  COMPARISON:  Radiographs 04/08/2014.  FINDINGS: Exam quality is  mildly degraded by body habitus.  Vascular: The pulmonary arteries are well opacified with contrast. There is no evidence of acute pulmonary embolism. No systemic vascular abnormalities identified. The heart size is normal. There is no pericardial effusion.  Mediastinum: There are no enlarged mediastinal, hilar or axillary lymph nodes. The thyroid gland, trachea and esophagus demonstrate no significant findings.  Lungs/Pleura: There is no pleural effusion.The lungs are clear aside from mild dependent atelectasis bilaterally. There is no focal airspace disease or suspicious nodule.  Upper abdomen: The liver appears enlarged with decreased density consistent with steatosis. There is no adrenal mass.  Musculoskeletal/Chest wall: No chest wall lesion or acute osseous findings.  Review of the MIP images confirms the above findings.  IMPRESSION: 1. No evidence of acute pulmonary embolism or other acute chest findings. 2. Mild dependent atelectasis at both lung bases. 3. Mild hepatic steatosis.   Electronically Signed   By: Roxy Horseman M.D.   On: 04/10/2014 20:13     EKG Interpretation   Date/Time:  Wednesday April 10 2014 15:17:29 EST Ventricular Rate:  111 PR Interval:  152 QRS Duration: 92 QT Interval:  344 QTC Calculation: 467 R Axis:   -7 Text Interpretation:  Sinus tachycardia Nonspecific ST abnormality  Abnormal ECG Confirmed by RAY MD, Duwayne HeckANIELLE ([id number]) on 04/10/2014 5:03:41 PM      MDM   Final diagnoses:  Right-sided chest wall pain  Tachycardia   Low risk for major cardiac event per HEART score Low risk for PE via Well's Criteria as pt is only mildly tachycardic, otherwise, no risk factors. O2- 96% on RA.  Cannot use PERC criteria as pt is tachycardic. Will get D-dimer. Pain is reproducible with palpation and movement. Pain likely muscular in nature.  6:11 PM D-dimer: elevated, 1.08. Concerning for possible PE. Will get CT angio chest to r/o PE.  Pt states after he was given valium  and norco his chest pain has resolved even with movement.    CT angio chest: no evidence of acute PE or other acute chest findings.  Pt states pain has resolved.  Pt O2 is 100% on RA.   Pt states he feels comfortable being discharged home. Will tx for muscular right chest wall pain. Rx: tramadol. Home care instructions provided. Advised to f/u with PCP as needed. Return precautions provided. Pt verbalized understanding and agreement with tx plan.    Junius FinnerErin O'Malley, PA-C 04/11/14 [id number]  Hilario Quarryanielle S Ray, MD 04/11/14 [id number]

## 2014-04-10 NOTE — Discharge Instructions (Signed)
Chest Pain (Nonspecific) °It is often hard to give a diagnosis for the cause of chest pain. There is always a chance that your pain could be related to something serious, such as a heart attack or a blood clot in the lungs. You need to follow up with your doctor. °HOME CARE °· If antibiotic medicine was given, take it as directed by your doctor. Finish the medicine even if you start to feel better. °· For the next few days, avoid activities that bring on chest pain. Continue physical activities as told by your doctor. °· Do not use any tobacco products. This includes cigarettes, chewing tobacco, and e-cigarettes. °· Avoid drinking alcohol. °· Only take medicine as told by your doctor. °· Follow your doctor's suggestions for more testing if your chest pain does not go away. °· Keep all doctor visits you made. °GET HELP IF: °· Your chest pain does not go away, even after treatment. °· You have a rash with blisters on your chest. °· You have a fever. °GET HELP RIGHT AWAY IF:  °· You have more pain or pain that spreads to your arm, neck, jaw, back, or belly (abdomen). °· You have shortness of breath. °· You cough more than usual or cough up blood. °· You have very bad back or belly pain. °· You feel sick to your stomach (nauseous) or throw up (vomit). °· You have very bad weakness. °· You pass out (faint). °· You have chills. °This is an emergency. Do not wait to see if the problems will go away. Call your local emergency services (911 in [place] Do not drive yourself to the hospital. °MAKE SURE YOU:  °· Understand these instructions. °· Will watch your condition. °· Will get help right away if you are not doing well or get worse. °Document Released: 09/01/2007 Document Revised: 03/20/2013 Document Reviewed: 09/01/2007 °ExitCare® Patient Information ©2015 [employer]. This information is not intended to replace advice given to you by your health care provider. Make sure you discuss any questions you have with your  health care provider. ° °Chest Wall Pain °Chest wall pain is pain in or around the bones and muscles of your chest. It may take up to 6 weeks to get better. It may take longer if you must stay physically active in your work and activities.  °CAUSES  °Chest wall pain may happen on its own. However, it may be caused by: °· A viral illness like the flu. °· Injury. °· Coughing. °· Exercise. °· Arthritis. °· Fibromyalgia. °· Shingles. °HOME CARE INSTRUCTIONS  °· Avoid overtiring physical activity. Try not to strain or perform activities that cause pain. This includes any activities using your chest or your abdominal and side muscles, especially if heavy weights are used. °· Put ice on the sore area. °¨ Put ice in a plastic bag. °¨ Place a towel between your skin and the bag. °¨ Leave the ice on for 15-20 minutes per hour while awake for the first 2 days. °· Only take over-the-counter or prescription medicines for pain, discomfort, or fever as directed by your caregiver. °SEEK IMMEDIATE MEDICAL CARE IF:  °· Your pain increases, or you are very uncomfortable. °· You have a fever. °· Your chest pain becomes worse. °· You have new, unexplained symptoms. °· You have nausea or vomiting. °· You feel sweaty or lightheaded. °· You have a cough with phlegm (sputum), or you cough up blood. °MAKE SURE YOU:  °· Understand these instructions. °· Will watch your   condition. °· Will get help right away if you are not doing well or get worse. °Document Released: 03/15/2005 Document Revised: 06/07/2011 Document Reviewed: 11/09/2010 °ExitCare® Patient Information ©2015 [employer]. This information is not intended to replace advice given to you by your health care provider. Make sure you discuss any questions you have with your health care provider. ° °

## 2014-04-10 NOTE — ED Notes (Signed)
MD at bedside. 

## 2014-04-10 NOTE — ED Notes (Signed)
Patient transported to CT 

## 2014-04-10 NOTE — Progress Notes (Signed)
   Subjective:    Patient ID: Bruce Macias, male    DOB: February 26, 1981, 34 y.o.   MRN: [id number]  HPI  This is a 34 year old male presenting with 2 days of chest pain. Felt like pulling in anterior chest. Felt it when he turned while standing. Only feels it with rotating trunk. No pain when sitting still. Feeling better now. No SOB, diaphoresis, palpitations. Tried robaxin which helps some. Notes he is "always tachycardic at doctor's office."  No known history of cardiac problems.   He has a history of HTN. No currently on amy medication Married, smoker  Review of Systems  Patient Active Problem List   Diagnosis Date Noted  . HTN (hypertension) 05/14/2011  . Obesity 05/14/2011  . Hyperlipidemia 05/14/2011  . OSA (obstructive sleep apnea) 05/14/2011   Prior to Admission medications   Medication Sig Start Date End Date Taking? Authorizing Provider  doxycycline (VIBRA-TABS) 100 MG tablet Take 1 tablet (100 mg total) by mouth 2 (two) times daily. 04/08/14  Yes Jonita Albeehris W Guest, MD  ibuprofen (ADVIL,MOTRIN) 600 MG tablet Take 1 tablet (600 mg total) by mouth every 8 (eight) hours as needed. 04/08/14  Yes Jonita Albeehris W Guest, MD  methocarbamol (ROBAXIN-750) 750 MG tablet Take 1 tablet (750 mg total) by mouth 4 (four) times daily. 04/08/14  Yes Jonita Albeehris W Guest, MD      Objective:   Physical Exam  Constitutional: He is oriented to person, place, and time. He appears well-developed and well-nourished. No distress.  HENT:  Head: Normocephalic and atraumatic.  Right Ear: Hearing normal.  Left Ear: Hearing normal.  Nose: Nose normal.  Eyes: Conjunctivae and lids are normal. Right eye exhibits no discharge. Left eye exhibits no discharge. No scleral icterus.  Cardiovascular: Regular rhythm.  Tachycardia present.   Pulmonary/Chest: Effort normal. No respiratory distress. He has no wheezes. He has rhonchi (few in lower lung bases). He has no rales. He exhibits tenderness (midsternal chest).    Abdominal:  Soft. Normal appearance. There is no tenderness.  Musculoskeletal: Normal range of motion.       Right shoulder: Normal. He exhibits normal range of motion.       Left shoulder: Normal. He exhibits normal range of motion.  Pain with rotation of trunk to both sides.  Neurological: He is alert and oriented to person, place, and time.  Skin: Skin is warm, dry and intact. No lesion and no rash noted.  Psychiatric: He has a normal mood and affect. His speech is normal and behavior is normal. Thought content normal.   BP 118/82 mmHg  Pulse 110  Temp(Src) 98.9 F (37.2 C) (Oral)  Resp 18  Ht 5\' 10"  (1.778 m)  Wt 393 lb (178.264 kg)  BMI 56.39 kg/m2  SpO2 96%   EKG: sinus tachycardia with mild ST depression in chest leads.  No prior for comparison  Noted to have abnormal EKG with chest pain.  Although he is young he is quite obese.   Given asa 81 #4 once, placed O2 via [place] 2L, and started IV in the left hand.  Called EMS for transport    Assessment & Plan:  Intercostal pain - Plan: EKG 12-Lead  Other chest pain - Plan: aspirin chewable tablet 324 mg  Chest pain- persistent since yesterday- with abnormal EKG.  Likely MSK but needs rule- out.

## 2014-04-10 NOTE — ED Notes (Signed)
Pt returned from CT °

## 2015-01-14 ENCOUNTER — Encounter: Payer: 59 | Admitting: Physician Assistant

## 2015-02-05 ENCOUNTER — Ambulatory Visit (INDEPENDENT_AMBULATORY_CARE_PROVIDER_SITE_OTHER): Payer: 59 | Admitting: Physician Assistant

## 2015-02-05 ENCOUNTER — Encounter: Payer: Self-pay | Admitting: Physician Assistant

## 2015-02-05 ENCOUNTER — Telehealth: Payer: Self-pay | Admitting: Physician Assistant

## 2015-02-05 VITALS — BP 126/86 | HR 98 | Temp 98.8°F | Resp 16 | Ht 68.5 in | Wt 382.4 lb

## 2015-02-05 DIAGNOSIS — Z23 Encounter for immunization: Secondary | ICD-10-CM | POA: Diagnosis not present

## 2015-02-05 DIAGNOSIS — L0592 Pilonidal sinus without abscess: Secondary | ICD-10-CM

## 2015-02-05 DIAGNOSIS — K760 Fatty (change of) liver, not elsewhere classified: Secondary | ICD-10-CM

## 2015-02-05 DIAGNOSIS — E669 Obesity, unspecified: Secondary | ICD-10-CM

## 2015-02-05 DIAGNOSIS — E785 Hyperlipidemia, unspecified: Secondary | ICD-10-CM | POA: Diagnosis not present

## 2015-02-05 DIAGNOSIS — Z720 Tobacco use: Secondary | ICD-10-CM | POA: Diagnosis not present

## 2015-02-05 DIAGNOSIS — G4733 Obstructive sleep apnea (adult) (pediatric): Secondary | ICD-10-CM | POA: Diagnosis not present

## 2015-02-05 DIAGNOSIS — L0591 Pilonidal cyst without abscess: Secondary | ICD-10-CM | POA: Diagnosis not present

## 2015-02-05 DIAGNOSIS — Z0000 Encounter for general adult medical examination without abnormal findings: Secondary | ICD-10-CM | POA: Diagnosis not present

## 2015-02-05 DIAGNOSIS — I10 Essential (primary) hypertension: Secondary | ICD-10-CM | POA: Diagnosis not present

## 2015-02-05 DIAGNOSIS — F17200 Nicotine dependence, unspecified, uncomplicated: Secondary | ICD-10-CM

## 2015-02-05 LAB — CBC WITH DIFFERENTIAL/PLATELET
BASOS ABS: 0 10*3/uL (ref 0.0–0.1)
Basophils Relative: 0 % (ref 0–1)
EOS ABS: 0.2 10*3/uL (ref 0.0–0.7)
Eosinophils Relative: 2 % (ref 0–5)
HCT: 44.2 % (ref 39.0–52.0)
Hemoglobin: 15.2 g/dL (ref 13.0–17.0)
Lymphocytes Relative: 32 % (ref 12–46)
Lymphs Abs: 3.6 10*3/uL (ref 0.7–4.0)
MCH: 30.8 pg (ref 26.0–34.0)
MCHC: 34.4 g/dL (ref 30.0–36.0)
MCV: 89.7 fL (ref 78.0–100.0)
MPV: 9 fL (ref 8.6–12.4)
Monocytes Absolute: 0.7 10*3/uL (ref 0.1–1.0)
Monocytes Relative: 6 % (ref 3–12)
NEUTROS PCT: 60 % (ref 43–77)
Neutro Abs: 6.7 10*3/uL (ref 1.7–7.7)
PLATELETS: 365 10*3/uL (ref 150–400)
RBC: 4.93 MIL/uL (ref 4.22–5.81)
RDW: 13.7 % (ref 11.5–15.5)
WBC: 11.1 10*3/uL — ABNORMAL HIGH (ref 4.0–10.5)

## 2015-02-05 LAB — TSH: TSH: 2.257 u[IU]/mL (ref 0.350–4.500)

## 2015-02-05 MED ORDER — VARENICLINE TARTRATE 1 MG PO TABS: 1.0000 mg | ORAL_TABLET | Freq: Two times a day (BID) | ORAL | Status: DC

## 2015-02-05 MED ORDER — LISINOPRIL 10 MG PO TABS: 10.0000 mg | ORAL_TABLET | Freq: Every day | ORAL | Status: DC

## 2015-02-05 NOTE — Progress Notes (Signed)
Subjective:    Patient ID: Bruce Macias, male    DOB: [birth year], 34 y.o.   MRN: [id number]  Chief Complaint  Patient presents with  . Annual Exam  . Medication Refill    Lisinopril   HPI Patient presents today for his annual wellness visit and medication refill of lisinopril.   Was last seen by Dr. Patsy Lager on 04/10/2014 for right-sided chest wall pain with mild ST depression on EKG, which prompted further work-up at the ED for chest pain rule-out. Found to be musculoskeletal in nature. Patient was discharged home with tramadol. Patient has not had any episodes of chest pain since that time. No SOB, palpitations, or diaphoresis.   HTN: Patient is currently on 10 mg lisinopril daily. Has been out of this medication for several months now, but has been taking his wife, [name], lisinopril in the interim. Has a BP machine at home, but needs to get batteries for it.   HLD: Patient not currently on statin therapy. Last lipid panel obtained on 04/07/2011 (TC 206). Was previously on simvastatin (2013).  Obesity: Patient has lost 11 pounds since January. Going to the gym 3-4 mornings/week. Patient states that after his chest pain episode in January, he felt like he needed to "make some changes" to his lifestyle. He remains motivated to continue exercising and lose more weight. Considering gastric sleeve surgery. Patient's wife had gastric sleeve surgery last year and lost over 100 pounds.  OSA: Has been using a CPAP machine since 2013 without any problems. Sleeps 5-6 hours/night on average. Does not like sleeping more than this because it makes him feel tired during the day.   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 Referral to [facility] discussed on 04/08/2014 with Dr. Perrin Maltese, however the patient has not followed up on this yet d/t financial constraints. Cyst is painless, but will spontaneously open up and drain while the patient is sitting down. Would like to discuss another  referral on today's visit.   Patient is a current everyday smoker. Would like to discuss smoking cessation options so that he can become a candidate for gastric sleeve surgery. Has tried Chantix in the past.   Patient works full-time as a Social research officer, government. Recently started an online bachelor's degree program through [employer] in [employer]. Plans to graduate in December 2018. Feels like he has a lot to juggle right now between work, school, and his family. Has a 83 yo daughter, [name], who he enjoys spending time with when he gets home from work, but states he has trouble getting all of his work done in the evenings. Would like to discuss starting stimulant therapy to help him focus and concentrate on school work at night.   Patient also complains of intermittent b/l low back pain and tightness. Worse with activity. Relieved with rest on the weekends, but when he resumes his weekday gym routine the pain returns with greater intensity. Does not recall any specific inciting event or trauma. Denies numbness or tingling in his legs or loss of bowel or bladder control. Understands weight loss would likely help with his back pain.   Elevated liver enzymes: Elevated in 2013 (ALT 61, AST 57). Mild hepatic steatosis noted on CT angiography of the chest on 04/10/2014.   Patient is due for his annual flu vaccination, would like to get this today.  No other concerns on today's visit.   Review of Systems  Constitutional: Negative for fever, chills, fatigue and unexpected weight  change.  HENT: Negative for sore throat.   Respiratory: Negative for cough, chest tightness, shortness of breath and wheezing.   Cardiovascular: Negative for chest pain, palpitations and leg swelling.  Gastrointestinal: Negative for nausea, vomiting, diarrhea and constipation.  Genitourinary:       Chronic pilonidal cyst. Painless, but will spontaneously open up and drain when he sits down.   Musculoskeletal:  Positive for back pain (intermittent, low back pain; worse with activity). Negative for myalgias, joint swelling and arthralgias.  Neurological: Negative for dizziness, light-headedness and headaches.  Psychiatric/Behavioral: Positive for decreased concentration (at night when he's trying to get his school work done).       Objective:   Physical Exam  Constitutional: He is oriented to person, place, and time. No distress.  BP 126/86 mmHg  Pulse 98  Temp(Src) 98.8 F (37.1 C) (Oral)  Resp 16  Ht 5' 8.5" (1.74 m)  Wt 382 lb 6.4 oz (173.456 kg)  BMI 57.29 kg/m2. Obese male.  HENT:  Head: Normocephalic and atraumatic.  Right Ear: External ear normal.  Left Ear: External ear normal.  Nose: Nose normal.  Mouth/Throat: Oropharynx is clear and moist. No oropharyngeal exudate.  Eyes: EOM are normal. No scleral icterus.  Neck: Neck supple.  Cardiovascular: Normal rate, regular rhythm, normal heart sounds and intact distal pulses.  Exam reveals no gallop and no friction rub.   No murmur heard. Pulmonary/Chest: Effort normal and breath sounds normal. No respiratory distress. He has no wheezes. He has no rales.  Abdominal: Soft. Bowel sounds are normal. He exhibits no distension and no mass. There is no tenderness.  Musculoskeletal: He exhibits no tenderness.  Back non-tender to palpation.  Lymphadenopathy:    He has no cervical adenopathy.  Neurological: He is alert and oriented to person, place, and time. He has normal reflexes.  Skin: Skin is warm and dry. He is not diaphoretic.  Chronic, small pilonidal cyst noted on right intergluteal fold. No drainage, surrounding erythema, or warmth.   Psychiatric: He has a normal mood and affect. His behavior is normal. Judgment and thought content normal.   Results for orders placed or performed during the hospital encounter of 04/10/14  CBC  Result Value Ref Range   WBC 10.9 (H) 4.0 - 10.5 K/uL   RBC 5.02 4.22 - 5.81 MIL/uL   Hemoglobin 15.9  13.0 - 17.0 g/dL   HCT 40.9 81.1 - 91.4 %   MCV 92.8 78.0 - 100.0 fL   MCH 31.7 26.0 - 34.0 pg   MCHC 34.1 30.0 - 36.0 g/dL   RDW 78.2 95.6 - 21.3 %   Platelets 340 150 - 400 K/uL  Basic metabolic panel  Result Value Ref Range   Sodium 137 135 - 145 mmol/L   Potassium 3.6 3.5 - 5.1 mmol/L   Chloride 98 96 - 112 mEq/L   CO2 28 19 - 32 mmol/L   Glucose, Bld 114 (H) 70 - 99 mg/dL   BUN 10 6 - 23 mg/dL   Creatinine, Ser 0.86 0.50 - 1.35 mg/dL   Calcium 9.4 8.4 - 57.8 mg/dL   GFR calc non Af Amer 89 (L) >90 mL/min   GFR calc Af Amer >90 >90 mL/min   Anion gap 11 5 - 15  D-dimer, quantitative  Result Value Ref Range   D-Dimer, Quant 1.08 (H) 0.00 - 0.48 ug/mL-FEU  I-stat troponin, ED (not at [facility])  Result Value Ref Range   Troponin i, poc 0.00 0.00 - 0.08 ng/mL  Comment 3              Assessment & Plan:  1. Annual physical exam - Appropriate, age-specific anticipatory guidance provided.   2. Need for prophylactic vaccination and inoculation against influenza - Flu Vaccine QUAD 36+ mos IM  3. Essential hypertension - Comprehensive metabolic panel - CBC with Differential/Platelet - TSH - lisinopril (PRINIVIL,ZESTRIL) 10 MG tablet; Take 1 tablet (10 mg total) by mouth daily.  Dispense: 90 tablet; Refill: 3  4. Hyperlipidemia - Lipid panel  5. Obesity - Continue to exercise and consume a well-balanced diet as much as possible. Understands weight loss will also help relieve his back pain.   6. OSA (obstructive sleep apnea) - Continue CPAP machine and work on getting more sleep at night. Aim for 7-8 hours/night.   7. Smoker - Discussed setting a smoking cessation quit date.   - varenicline (CHANTIX) 1 MG tablet; Take 1 tablet (1 mg total) by mouth 2 (two) times daily.  Dispense: 60 tablet; Refill: 5  8. Hepatic steatosis - Awaiting hepatic enzyme panel results.   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

## 2015-02-05 NOTE — Patient Instructions (Addendum)
I will contact you with your lab results as soon as they are available.   If you have not heard from me in 2 weeks, please contact me.  The fastest way to get your results is to register for My Chart (see the instructions on the last page of this printout).  Keeping you healthy  Get these tests  Blood pressure- Have your blood pressure checked once a year by your healthcare provider.  Normal blood pressure is 120/80.  Weight- Have your body mass index (BMI) calculated to screen for obesity.  BMI is a measure of body fat based on height and weight. You can also calculate your own BMI at https://www.west-esparza.com/www.nhlbisupport.com/bmi/.  Cholesterol- Have your cholesterol checked regularly starting at age 36, sooner may be necessary if you have diabetes, high blood pressure, if a family member developed heart diseases at an early age or if you smoke.   Chlamydia, HIV, and other sexual transmitted disease- Get screened each year until the age of 36 then within three months of each new sexual partner.  Diabetes- Have your blood sugar checked regularly if you have high blood pressure, high cholesterol, a family history of diabetes or if you are overweight.  Get these vaccines  Flu shot- Every fall.  Tetanus shot- Every 10 years.  Menactra- Single dose; prevents meningitis.  Take these steps  Don't smoke- If you do smoke, ask your healthcare provider about quitting. For tips on how to quit, go to www.smokefree.gov or call 1-800-QUIT-NOW.  Be physically active- Exercise 5 days a week for at least 30 minutes.  If you are not already physically active start slow and gradually work up to 30 minutes of moderate physical activity.  Examples of moderate activity include walking briskly, mowing the yard, dancing, swimming bicycling, etc.  Eat a healthy diet- Eat a variety of healthy foods such as fruits, vegetables, low fat milk, low fat cheese, yogurt, lean meats, poultry, fish, beans, tofu, etc.  For more information  on healthy eating, go to www.thenutritionsource.org  Drink alcohol in moderation- Limit alcohol intake two drinks or less a day.  Never drink and drive.  Dentist- Brush and floss teeth twice daily; visit your dentis twice a year.  Depression-Your emotional health is as important as your physical health.  If you're feeling down, losing interest in things you normally enjoy please talk with your healthcare provider.  Gun Safety- If you keep a gun in your home, keep it unloaded and with the safety lock on.  Bullets should be stored separately.  Helmet use- Always wear a helmet when riding a motorcycle, bicycle, rollerblading or skateboarding.  Safe sex- If you may be exposed to a sexually transmitted infection, use a condom  Seat belts- Seat bels can save your life; always wear one.  Smoke/Carbon Monoxide detectors- These detectors need to be installed on the appropriate level of your home.  Replace batteries at least once a year.  Skin Cancer- When out in the sun, cover up and use sunscreen SPF 15 or higher.  Violence- If anyone is threatening or hurting you, please tell your healthcare provider.  Initial Chantix instructions: Set a quit date at least 7 days in the future. You should continue smoking for at least 7 days while taking the Chantix. Take 1/2 tablet once daily for 4 days, then 1/2 tablet twice daily for 4 days, then 1/2 tablet each morning and 1 whole tablet each evening for 4 days, then take 1 whole tablet twice daily.

## 2015-02-05 NOTE — Progress Notes (Signed)
Patient ID: Bruce Macias, male    DOB: 1981-03-14, 34 y.o.   MRN: [id number]  PCP: Bruce Simmer, MD  Chief Complaint  Patient presents with  . Annual Exam  . Medication Refill    Lisinopril    Subjective:   HPI: Patient presents today for his annual wellness visit and medication refill of lisinopril.   Was last seen here by Dr. Patsy Macias on 04/10/2014 for right-sided chest wall pain with mild ST depression on EKG, which prompted further work-up at the ED for MI rule-out. Found to be musculoskeletal in nature. Patient was discharged home with tramadol. Patient has not had any episodes of chest pain since that time. No SOB, palpitations, or diaphoresis.   HTN: Patient is currently on 10 mg lisinopril daily. Has been out of this medication for several months now, but has been taking his wife, Bruce Macias, lisinopril in the interim. Has a BP machine at home, but needs to get batteries for it.   HLD: Patient not currently on statin therapy. Last lipid panel obtained on 04/07/2011 (TC 206). Was previously on simvastatin (2013).   Obesity: Patient has lost 11 pounds since January. Going to the gym 3-4 mornings/week. Patient states that after his chest pain episode in January, he felt like he needed to "make some changes" to his lifestyle. He remains motivated to continue exercising and lose more weight. Considering gastric sleeve surgery. Patient's wife had gastric sleeve surgery last year and lost over 100 pounds. He has to be tobacco free for 6 months to qualify for the procedure.   OSA: Has been using a CPAP machine since 2013 without any problems. Sleeps 5-6 hours/night on average. Does not like sleeping more than this because it makes him feel tired during the day.   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 Referral to [facility] ([facility] discussed on 04/08/2014 with Dr. Perrin Macias (and as far back as 04/2010), however the patient has not followed up on this yet d/t financial constraints.  Cyst is painless, but will spontaneously open up and drain while the patient is sitting down. Would like to discuss another referral on today's visit.   Patient is a current everyday smoker. Would like to discuss smoking cessation options so that he can become a candidate for gastric sleeve surgery. Has tried Chantix in the past, and would like to try again.   Patient works full-time as a Social research officer, government. Recently started an online bachelor's degree program through [employer] in [employer]. Plans to graduate in December 2018. Feels like he has a lot to juggle right now between work, school, and his family. Has a 21 yo daughter, Bruce Macias, who he enjoys spending time with when he gets home from work, but states he has trouble getting all of his work done in the evenings. Would like to discuss starting stimulant therapy to help him focus and concentrate on school work at night. Reports that he sleeps 4-6 hours each night and if he sleeps more than that he feels more tired.  Patient also complains of intermittent b/l low back pain and tightness. Worse with activity. Relieved with rest on the weekends, but when he resumes his weekday gym routine the pain returns with greater intensity. Does not recall any specific inciting event or trauma. Denies numbness or tingling in his legs or loss of bowel or bladder control. Understands weight loss would likely help with his back pain.   Elevated liver enzymes: Elevated in 2013 (ALT 61, AST 57).  Mild hepatic steatosis noted on CT angiography of the chest on 04/10/2014.   Patient is due for his annual flu vaccination, would like to get this today.     Patient Active Problem List   Diagnosis Date Noted  . 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 HTN (hypertension) 05/14/2011  . Obesity 05/14/2011  . Hyperlipidemia 05/14/2011  . OSA (obstructive sleep apnea) 05/14/2011    Past Medical History  Diagnosis Date  . Hypertension   . Boil   . Condyloma  acuminatum of perianal region 06/2009    treated with imiquimod and then dermatology evaluation     Prior to Admission medications   Medication Sig Start Date End Date Taking? Authorizing Provider  lisinopril (PRINIVIL,ZESTRIL) 10 MG tablet Take 1 tablet (10 mg total) by mouth daily. 10/11/12  Yes Benjiman Core, MD  Menthol-Methyl Salicylate (MUSCLE RUB) 10-15 % CREA Apply 1 application topically as needed for muscle pain.   Yes Historical Provider, MD  Soft Lens Products (REWETTING DROPS) SOLN 1 drop by Does not apply route daily as needed (FOR CONTACTS).   Yes Historical Provider, MD    No Known Allergies  History reviewed. No pertinent past surgical history.  Family History  Problem Relation Age of Onset  . Cancer Father     Social History   Social History  . Marital Status: Married    Spouse Name: Bruce Macias  . Number of Children: 1  . Years of Education: N/A   Occupational History  . case worker     [employer]   Social History Main Topics  . Smoking status: Current Some Day Smoker -- 0.33 packs/day for 10 years    Types: Cigarettes  . Smokeless tobacco: None  . Alcohol Use: No  . Drug Use: No  . Sexual Activity: No   Other Topics Concern  . None   Social History Narrative   Exercise cardio and free weights 3-4 days/week.   Lives with his wife and daughter.   Attending online-degree program (bachelor's) through [employer].       Review of Systems Constitutional: Negative for fever, chills, fatigue and unexpected weight change.  HENT: Negative for sore throat.  Respiratory: Negative for cough, chest tightness, shortness of breath and wheezing.  Cardiovascular: Negative for chest pain, palpitations and leg swelling.  Gastrointestinal: Negative for nausea, vomiting, diarrhea and constipation.  Genitourinary:  Chronic pilonidal cyst. Painless, but will spontaneously open up and drain when he sits down.  Musculoskeletal: Positive for back pain (intermittent, low  back pain; worse with activity). Negative for myalgias, joint swelling and arthralgias.  Neurological: Negative for dizziness, light-headedness and headaches.  Psychiatric/Behavioral: Positive for decreased concentration (at night when he's trying to get his school work done).          Objective:  Physical Exam  Constitutional: He is oriented to person, place, and time. Vital signs are normal. He appears well-developed and well-nourished. He is active and cooperative. No distress.  BP 126/86 mmHg  Pulse 98  Temp(Src) 98.8 F (37.1 C) (Oral)  Resp 16  Ht 5' 8.5" (1.74 m)  Wt 382 lb 6.4 oz (173.456 kg)  BMI 57.29 kg/m2  HENT:  Head: Normocephalic and atraumatic.  Right Ear: Hearing normal.  Left Ear: Hearing normal.  Eyes: Conjunctivae are normal. No scleral icterus.  Neck: Normal range of motion. Neck supple. No thyromegaly present.  Cardiovascular: Normal rate, regular rhythm and normal heart sounds.   Pulses:      Radial pulses are 2+ on  the right side, and 2+ on the left side.  Pulmonary/Chest: Effort normal and breath sounds normal.  Genitourinary:     Lymphadenopathy:       Head (right side): No tonsillar, no preauricular, no posterior auricular and no occipital adenopathy present.       Head (left side): No tonsillar, no preauricular, no posterior auricular and no occipital adenopathy present.    He has no cervical adenopathy.       Right: No supraclavicular adenopathy present.       Left: No supraclavicular adenopathy present.  Neurological: He is alert and oriented to person, place, and time. No sensory deficit.  Skin: Skin is warm, dry and intact. No rash noted. No cyanosis or erythema. Nails show no clubbing.  Psychiatric: He has a normal mood and affect.           Assessment & Plan:  1. Annual physical exam Age appropriate anticipatory guidance provided.  2. Need for prophylactic vaccination and inoculation against influenza - Flu Vaccine QUAD 36+ mos  IM  3. Essential hypertension Controlled. Continue lisinopril. Continue efforts for smoking cessation and weight loss. - Comprehensive metabolic panel - CBC with Differential/Platelet - TSH - lisinopril (PRINIVIL,ZESTRIL) 10 MG tablet; Take 1 tablet (10 mg total) by mouth daily.  Dispense: 90 tablet; Refill: 3  4. Hyperlipidemia Await lab results. Anticipate restarting statin therapy. Continue efforts for weight loss. - Lipid panel  5. Obesity See above.  6. OSA (obstructive sleep apnea) Continue CPAP and lifestyle changes as above.  7. Smoker Counseled on initialtion of chantix. - varenicline (CHANTIX) 1 MG tablet; Take 1 tablet (1 mg total) by mouth 2 (two) times daily.  Dispense: 60 tablet; Refill: 5  8. Hepatic steatosis Await labs. - Comprehensive metabolic panel  9. Pilonidal fistula Refer to General Surgery.   Fernande Brashelle S. Gwenn Teodoro, PA-C Physician Assistant-Certified [facility]

## 2015-02-05 NOTE — Telephone Encounter (Signed)
Spoke with patient by phone, as we forgot to discuss it during our visit.  Will refer for surgical evaluation.  Orders Placed This Encounter  Procedures  . Ambulatory referral to General Surgery    Referral Priority:  Routine    Referral Type:  Surgical    Referral Reason:  Specialty Services Required    Requested Specialty:  General Surgery    Number of Visits Requested:  1

## 2015-02-06 LAB — LIPID PANEL
Cholesterol: 203 mg/dL — ABNORMAL HIGH (ref 125–200)
HDL: 41 mg/dL (ref >=40)
LDL CALC: 110 mg/dL (ref <130)
Total CHOL/HDL Ratio: 5 Ratio (ref <=5.0)
Triglycerides: 262 mg/dL — ABNORMAL HIGH (ref <150)
VLDL: 52 mg/dL — AB (ref <30)

## 2015-02-06 LAB — COMPREHENSIVE METABOLIC PANEL
ALT: 55 U/L — ABNORMAL HIGH (ref 9–46)
AST: 45 U/L — ABNORMAL HIGH (ref 10–40)
Albumin: 4.4 g/dL (ref 3.6–5.1)
Alkaline Phosphatase: 76 U/L (ref 40–115)
BUN: 16 mg/dL (ref 7–25)
CHLORIDE: 96 mmol/L — AB (ref 98–110)
CO2: 24 mmol/L (ref 20–31)
CREATININE: 0.82 mg/dL (ref 0.60–1.35)
Calcium: 9.9 mg/dL (ref 8.6–10.3)
Glucose, Bld: 86 mg/dL (ref 65–99)
Potassium: 4.2 mmol/L (ref 3.5–5.3)
SODIUM: 134 mmol/L — AB (ref 135–146)
Total Bilirubin: 0.4 mg/dL (ref 0.2–1.2)
Total Protein: 7.6 g/dL (ref 6.1–8.1)

## 2015-02-11 ENCOUNTER — Encounter: Payer: Self-pay | Admitting: Physician Assistant

## 2015-02-24 ENCOUNTER — Encounter: Payer: Self-pay | Admitting: Family Medicine

## 2015-08-10 ENCOUNTER — Other Ambulatory Visit: Payer: Self-pay | Admitting: Physician Assistant

## 2015-08-12 NOTE — Telephone Encounter (Signed)
Chelle, it looks like Raynelle FanningJulie tried to call pt below. Do you want to give any RFs?

## 2015-08-12 NOTE — Telephone Encounter (Signed)
Called patient twice he answered and when I stated who I was hung up.

## 2015-08-12 NOTE — Telephone Encounter (Signed)
Meds ordered this encounter  Medications  . CHANTIX CONTINUING MONTH PAK 1 MG tablet    Sig: TAKE 1 TABLET BY MOUTH TWICE A DAY    Dispense:  60 tablet    Refill:  4

## 2015-12-31 ENCOUNTER — Encounter (HOSPITAL_COMMUNITY): Payer: Self-pay | Admitting: *Deleted

## 2015-12-31 ENCOUNTER — Emergency Department (HOSPITAL_COMMUNITY)
Admission: EM | Admit: 2015-12-31 | Discharge: 2016-01-01 | Disposition: A | Discharge status: Home / Self Care | Payer: 59 | Attending: Emergency Medicine | Admitting: Emergency Medicine

## 2015-12-31 DIAGNOSIS — Z79899 Other long term (current) drug therapy: Secondary | ICD-10-CM | POA: Diagnosis not present

## 2015-12-31 DIAGNOSIS — K5732 Diverticulitis of large intestine without perforation or abscess without bleeding: Secondary | ICD-10-CM | POA: Diagnosis not present

## 2015-12-31 DIAGNOSIS — F17210 Nicotine dependence, cigarettes, uncomplicated: Secondary | ICD-10-CM | POA: Diagnosis not present

## 2015-12-31 DIAGNOSIS — R109 Unspecified abdominal pain: Secondary | ICD-10-CM | POA: Diagnosis present

## 2015-12-31 DIAGNOSIS — I10 Essential (primary) hypertension: Secondary | ICD-10-CM | POA: Insufficient documentation

## 2015-12-31 LAB — URINALYSIS, ROUTINE W REFLEX MICROSCOPIC
BILIRUBIN URINE: NEGATIVE
GLUCOSE, UA: NEGATIVE mg/dL
KETONES UR: NEGATIVE mg/dL
Leukocytes, UA: NEGATIVE
Nitrite: NEGATIVE
PH: 7 (ref 5.0–8.0)
Protein, ur: 100 mg/dL — AB
Specific Gravity, Urine: 1.03 (ref 1.005–1.030)

## 2015-12-31 LAB — COMPREHENSIVE METABOLIC PANEL
ALBUMIN: 4 g/dL (ref 3.5–5.0)
ALT: 50 U/L (ref 17–63)
AST: 35 U/L (ref 15–41)
Alkaline Phosphatase: 74 U/L (ref 38–126)
Anion gap: 9 (ref 5–15)
BILIRUBIN TOTAL: 0.9 mg/dL (ref 0.3–1.2)
BUN: 8 mg/dL (ref 6–20)
CHLORIDE: 99 mmol/L — AB (ref 101–111)
CO2: 27 mmol/L (ref 22–32)
Calcium: 9.5 mg/dL (ref 8.9–10.3)
Creatinine, Ser: 0.9 mg/dL (ref 0.61–1.24)
GFR calc Af Amer: >60 mL/min (ref >60)
GFR calc non Af Amer: >60 mL/min (ref >60)
GLUCOSE: 122 mg/dL — AB (ref 65–99)
POTASSIUM: 4.3 mmol/L (ref 3.5–5.1)
SODIUM: 135 mmol/L (ref 135–145)
TOTAL PROTEIN: 7.6 g/dL (ref 6.5–8.1)

## 2015-12-31 LAB — CBC
HEMATOCRIT: 45.2 % (ref 39.0–52.0)
Hemoglobin: 15 g/dL (ref 13.0–17.0)
MCH: 31 pg (ref 26.0–34.0)
MCHC: 33.2 g/dL (ref 30.0–36.0)
MCV: 93.4 fL (ref 78.0–100.0)
Platelets: 304 10*3/uL (ref 150–400)
RBC: 4.84 MIL/uL (ref 4.22–5.81)
RDW: 13.5 % (ref 11.5–15.5)
WBC: 18.7 10*3/uL — ABNORMAL HIGH (ref 4.0–10.5)

## 2015-12-31 LAB — URINE MICROSCOPIC-ADD ON

## 2015-12-31 LAB — LIPASE, BLOOD: Lipase: 24 U/L (ref 11–51)

## 2015-12-31 NOTE — ED Triage Notes (Signed)
The pt is c/o abd pain and constipation since Monday no n or vomiting  He has had stools but they are hard and in small amounts

## 2016-01-01 ENCOUNTER — Emergency Department (HOSPITAL_COMMUNITY): Payer: 59

## 2016-01-01 ENCOUNTER — Encounter (HOSPITAL_COMMUNITY): Payer: Self-pay | Admitting: Radiology

## 2016-01-01 MED ORDER — METRONIDAZOLE IN NACL 5-0.79 MG/ML-% IV SOLN
500.0000 mg | Freq: Once | INTRAVENOUS | Status: AC
  Administered 2016-01-01: 500 mg via INTRAVENOUS
  Filled 2016-01-01: qty 100

## 2016-01-01 MED ORDER — IOPAMIDOL (ISOVUE-300) INJECTION 61%
INTRAVENOUS | Status: AC
  Filled 2016-01-01: qty 30

## 2016-01-01 MED ORDER — CIPROFLOXACIN HCL 500 MG PO TABS: 500.0000 mg | ORAL_TABLET | Freq: Two times a day (BID) | ORAL | 0 refills | Status: DC

## 2016-01-01 MED ORDER — CIPROFLOXACIN IN D5W 400 MG/200ML IV SOLN
400.0000 mg | Freq: Once | INTRAVENOUS | Status: AC
  Administered 2016-01-01: 400 mg via INTRAVENOUS
  Filled 2016-01-01: qty 200

## 2016-01-01 MED ORDER — METRONIDAZOLE 500 MG PO TABS: 500.0000 mg | ORAL_TABLET | Freq: Two times a day (BID) | ORAL | 0 refills | Status: DC

## 2016-01-01 NOTE — ED Provider Notes (Signed)
[facility]-EMERGENCY DEPT Provider Note   CSN: [id number] Arrival date & time: 12/31/15  1922     History   Chief Complaint Chief Complaint  Patient presents with  . Abdominal Pain    HPI Bruce Macias is a 35 y.o. male.  HPI Patient presents with abdominal pain. Symptoms began 3 days ago, with no clear precipitant. Since onset patient has had diminished bowel movements, now with small stool caliber, infrequent defecation, in spite of using milk of magnesia and other OTC medication. Patient also notes suprapubic discomfort, both with defecation and urination. Some discomfort with urination, though no persistent dysuria, no hematuria. Patient also developed chills today, and with concurrent anorexia, patient presents for evaluation. Patient has no history of abdominal surgery, nor kidney stone or  Past Medical History:  Diagnosis Date  . Boil 06/2010   LEFT inguinal  . Condyloma acuminatum of perianal region 06/2009   treated with imiquimod and then dermatology evaluation  . Erectile dysfunction   . Hypertension   . Psoriasis   . Vitamin D deficiency 2011   took vit D 50,000 IU weekly for brief period    Patient Active Problem List   Diagnosis Date Noted  . 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 Hepatic steatosis 02/05/2015  . HTN (hypertension) 05/14/2011  . Obesity 05/14/2011  . Hyperlipidemia 05/14/2011  . OSA (obstructive sleep apnea) 05/14/2011    History reviewed. No pertinent surgical history.     Home Medications    Prior to Admission medications   Medication Sig Start Date End Date Taking? Authorizing Provider  CHANTIX CONTINUING MONTH PAK 1 MG tablet TAKE 1 TABLET BY MOUTH TWICE A DAY 08/12/15   [name], PA-C  lisinopril (PRINIVIL,ZESTRIL) 10 MG tablet Take 1 tablet (10 mg total) by mouth daily. 02/05/15   [name], PA-C  Menthol-Methyl Salicylate (MUSCLE RUB) 10-15 % CREA Apply 1 application topically as needed for muscle pain.    Historical  Provider, MD  Soft Lens Products (REWETTING DROPS) SOLN 1 drop by Does not apply route daily as needed (FOR CONTACTS).    Historical Provider, MD    Family History Family History  Problem Relation Age of Onset  . Cancer Father     Social History Social History  Substance Use Topics  . Smoking status: Current Some Day Smoker    Packs/day: 0.33    Years: 10.00    Types: Cigarettes  . Smokeless tobacco: Never Used  . Alcohol use No     Allergies   Review of patient's allergies indicates no known allergies.   Review of Systems Review of Systems  Constitutional:       Per HPI, otherwise negative  HENT:       Per HPI, otherwise negative  Respiratory:       Per HPI, otherwise negative  Cardiovascular:       Per HPI, otherwise negative  Gastrointestinal: Positive for abdominal pain, constipation and nausea. Negative for abdominal distention, anal bleeding, blood in stool, diarrhea, rectal pain and vomiting.  Endocrine:       Negative aside from HPI  Genitourinary:       Neg aside from HPI   Musculoskeletal:       Per HPI, otherwise negative  Skin: Negative.   Neurological: Negative for syncope.     Physical Exam Updated Vital Signs BP 135/93 (BP Location: Right Arm)   Pulse 70   Temp 99.1 F (37.3 C) (Oral)   Resp 18   SpO2 99%  Physical Exam  Constitutional: He is oriented to person, place, and time. He appears well-developed. No distress.  Obese, uncomfortable appearing male speaking clearly  HENT:  Head: Normocephalic and atraumatic.  Eyes: Conjunctivae and EOM are normal.  Cardiovascular: Normal rate and regular rhythm.   Pulmonary/Chest: Effort normal. No stridor. No respiratory distress.  Abdominal: He exhibits no distension.  Discomfort about the suprapubic region, no peritoneal findings  Musculoskeletal: He exhibits no edema.  Neurological: He is alert and oriented to person, place, and time.  Skin: Skin is warm and dry.  Psychiatric: He has a  normal mood and affect.  Nursing note and vitals reviewed.    ED Treatments / Results  Labs (all labs ordered are listed, but only abnormal results are displayed) Labs Reviewed  COMPREHENSIVE METABOLIC PANEL - Abnormal; Notable for the following:       Result Value   Chloride 99 (*)    Glucose, Bld 122 (*)    All other components within normal limits  CBC - Abnormal; Notable for the following:    WBC 18.7 (*)    All other components within normal limits  URINALYSIS, ROUTINE W REFLEX MICROSCOPIC (NOT AT [facility]) - Abnormal; Notable for the following:    Hgb urine dipstick TRACE (*)    Protein, ur 100 (*)    All other components within normal limits  URINE MICROSCOPIC-ADD ON - Abnormal; Notable for the following:    Squamous Epithelial / LPF 0-5 (*)    Bacteria, UA FEW (*)    All other components within normal limits  LIPASE, BLOOD     Radiology Ct Abdomen Pelvis Wo Contrast  Result Date: 01/01/2016 CLINICAL DATA:  Abdominal pain and constipation for 4 days. Dysuria. History of hypertension. EXAM: CT ABDOMEN AND PELVIS WITHOUT CONTRAST TECHNIQUE: Multidetector CT imaging of the abdomen and pelvis was performed following the standard protocol without IV contrast. Oral contrast administered. COMPARISON:  None. FINDINGS: Large body habitus results in overall noisy image quality. LOWER CHEST: Lung bases are clear. The visualized heart size is normal. No pericardial effusion. HEPATOBILIARY: The liver is diffusely hypodense compatible with steatosis with mild focal fatty sparing about the gallbladder fossa. Gallbladder is normal. PANCREAS: Normal. SPLEEN: Normal. ADRENALS/URINARY TRACT: Kidneys are orthotopic, demonstrating normal size and morphology. No nephrolithiasis, hydronephrosis; limited assessment for renal masses on this nonenhanced examination. The unopacified ureters are normal in course and caliber. Urinary bladder is partially distended and unremarkable. Normal adrenal glands.  STOMACH/BOWEL: Mild colonic diverticulosis with superimposed short segment of sigmoid bowel wall thickening and pericolonic inflammation associated with diverticula. The stomach, small bowel are normal in course and caliber without inflammatory changes. Mild amount of retained large bowel stool. Normal appendix. VASCULAR/LYMPHATIC: Aortoiliac vessels are normal in course and caliber, trace calcific atherosclerosis. No lymphadenopathy by CT size criteria. REPRODUCTIVE: Normal. OTHER: No intraperitoneal free fluid or free air. MUSCULOSKELETAL: Non-acute. Mild lower lumbar facet arthropathy. Mild sacroiliac osteoarthrosis. IMPRESSION: Acute sigmoid diverticulitis without complication. No urolithiasis or obstructive uropathy. Electronically Signed   By: Awilda Metroourtnay  Bloomer M.D.   On: 01/01/2016 03:33    Procedures Procedures (including critical care time)  Medications Ordered in ED Medications  iopamidol (ISOVUE-300) 61 % injection (not administered)  ciprofloxacin (CIPRO) IVPB 400 mg (400 mg Intravenous New Bag/Given 01/01/16 0406)  metroNIDAZOLE (FLAGYL) IVPB 500 mg (500 mg Intravenous New Bag/Given 01/01/16 0406)     Initial Impression / Assessment and Plan / ED Course  I have reviewed the triage vital signs and the nursing  notes.  Pertinent labs & imaging results that were available during my care of the patient were reviewed by me and considered in my medical decision making (see chart for details).  Clinical Course    5:54 AM Patient awake and alert, we discussed all findings, with the patient's wife present. Patient will follow up with GI.  Young male presents with ongoing lower abdominal pain, change in bowel movements. The patient is young, there is some suspicion for colitis versus diverticulitis, less likely GU pathology. Here the patient's evaluation is notable for fever, leukocytosis, and CT evidence of diverticulitis. Patient tolerated initial Cipro, Flagyl well. With no evidence  for abscess, perforation, peritonitis, patient discharged with GI follow-up.  Final Clinical Impressions(s) / ED Diagnoses  Diverticulitis   Gerhard Munch, MD 01/01/16 [phone]

## 2016-01-01 NOTE — Discharge Instructions (Signed)
As discussed, with today's diagnosis of diverticulitis it is important that you monitor your condition carefully.  Do not hesitate to return here for concerning changes in your condition.  Otherwise, please be sure to follow-up with our gastroenterologists.

## 2016-02-19 ENCOUNTER — Other Ambulatory Visit: Payer: Self-pay | Admitting: Physician Assistant

## 2016-02-19 DIAGNOSIS — I10 Essential (primary) hypertension: Secondary | ICD-10-CM

## 2016-04-09 ENCOUNTER — Ambulatory Visit (INDEPENDENT_AMBULATORY_CARE_PROVIDER_SITE_OTHER): Payer: 59 | Admitting: Family Medicine

## 2016-04-09 VITALS — BP 190/130 | HR 105 | Temp 98.8°F | Resp 20 | Ht 68.5 in | Wt 399.4 lb

## 2016-04-09 DIAGNOSIS — K0889 Other specified disorders of teeth and supporting structures: Secondary | ICD-10-CM

## 2016-04-09 DIAGNOSIS — K029 Dental caries, unspecified: Secondary | ICD-10-CM

## 2016-04-09 DIAGNOSIS — I10 Essential (primary) hypertension: Secondary | ICD-10-CM | POA: Diagnosis not present

## 2016-04-09 DIAGNOSIS — Z716 Tobacco abuse counseling: Secondary | ICD-10-CM | POA: Diagnosis not present

## 2016-04-09 MED ORDER — HYDROCODONE-ACETAMINOPHEN 5-325 MG PO TABS: 1.0000 | ORAL_TABLET | Freq: Four times a day (QID) | ORAL | 0 refills | Status: DC | PRN

## 2016-04-09 MED ORDER — VARENICLINE TARTRATE 0.5 MG X 11 & 1 MG X 42 PO MISC: ORAL | 0 refills | Status: DC

## 2016-04-09 MED ORDER — LISINOPRIL 10 MG PO TABS: 10.0000 mg | ORAL_TABLET | Freq: Every day | ORAL | 3 refills | Status: DC

## 2016-04-09 MED ORDER — AMOXICILLIN 500 MG PO CAPS: 500.0000 mg | ORAL_CAPSULE | Freq: Three times a day (TID) | ORAL | 0 refills | Status: DC

## 2016-04-09 MED ORDER — VARENICLINE TARTRATE 1 MG PO TABS: 1.0000 mg | ORAL_TABLET | Freq: Two times a day (BID) | ORAL | 2 refills | Status: DC

## 2016-04-09 NOTE — Progress Notes (Signed)
Subjective:  By signing my name below, I, Bruce Macias, attest that this documentation has been prepared under the direction and in the presence of Bruce Staggers, MD. Electronically Signed: Stann Macias, Scribe. 04/09/2016 , 5:00 PM .  Patient was seen in Room 8 .   Patient ID: Bruce Macias, male    DOB: 12-31-1980, 36 y.o.   MRN: [id number] Chief Complaint  Patient presents with  . Dental Pain    x yesterday  . Medication Refill    Lisinopril; Chantix   HPI Bruce Macias is a 36 y.o. male  Patient is primarily here for a toothache but also requesting BP medication refills. His BP was markedly elevated at 190/130 on triage. He was last seen here in Nov 2016 for a physical. He's still working for [employer].   HTN His BP was markedly elevated at 190/130 on triage. He was on lisinopril 10mg  previously but has been out of his medication at that visit as well. He's been taking his wife's prescription. He also have history of OSA on cpap, and tobacco abuse. He also has history of elevated LFT's with hepatic steatosis on imaging in 2016.   He states his BP has always been good while on medication at random check-ups. He's been out of his medications since about the end of December. He denies seeing other providers regarding his HTN. He denies lightheadedness, dizziness, chest pain, palpitations or urinary symptoms. He denies any recent alcohol use. He has a BP cuff at home.   He is still using cpap for his OSA.   Lab Results  Component Value Date   CREATININE 0.90 12/31/2015    HLD Lab Results  Component Value Date   CHOL 203 (H) 02/05/2015   HDL 41 02/05/2015   LDLCALC 110 02/05/2015   TRIG 262 (H) 02/05/2015   CHOLHDL 5.0 02/05/2015    Tobacco abuse He was started on Chantix at last visit, and requests refill today. Patient states he was on the 4-5 months Chantix. He did quit, but "something came up" and still smokes occasionally. He reports not smoking as much as  before. He denies problems with Chantix.    Obesity He has been evaluated by [facility] in 2016 with nutritionist, Levi Aland, RD, LDN. He was released by them, but would like to continue with them where he was left off.   Wt Readings from Last 3 Encounters:  04/09/16 (!) 399 lb 6.4 oz (181.2 kg)  02/05/15 (!) 382 lb 6.4 oz (173.5 kg)  04/10/14 (!) 393 lb (178.3 kg)   Body mass index is 59.85 kg/m.   Dental Pain Patient also notes feeling left lower tooth pain yesterday. He took 2 tablets of ibuprofen 400mg , but the pain is still present. He noticed it's sitting on a cavity, but planned to fill later by dentist. He hasn't noticed an abscess or fever.   He's taken painkillers in the past without complications while on it or coming off of the medication.   Patient Active Problem List   Diagnosis Date Noted  . 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 Hepatic steatosis 02/05/2015  . HTN (hypertension) 05/14/2011  . Obesity 05/14/2011  . Hyperlipidemia 05/14/2011  . OSA (obstructive sleep apnea) 05/14/2011   Past Medical History:  Diagnosis Date  . Boil 06/2010   LEFT inguinal  . Condyloma acuminatum of perianal region 06/2009   treated with imiquimod and then dermatology evaluation  . Erectile dysfunction   . Hypertension   .  Psoriasis   . Vitamin D deficiency 2011   took vit D 50,000 IU weekly for brief period   No past surgical history on file. No Known Allergies Prior to Admission medications   Medication Sig Start Date End Date Taking? Authorizing Provider  Multiple Vitamin (MULTIVITAMIN WITH MINERALS) TABS tablet Take 1 tablet by mouth daily.   Yes Historical Provider, MD  CHANTIX CONTINUING MONTH PAK 1 MG tablet TAKE 1 TABLET BY MOUTH TWICE A DAY Patient not taking: Reported on 04/09/2016 08/12/15   [name], PA-C  lisinopril (PRINIVIL,ZESTRIL) 10 MG tablet Take 1 tablet (10 mg total) by mouth daily. Patient not taking: Reported on 04/09/2016 02/05/15    [name], PA-C   Social History   Social History  . Marital status: Married    Spouse name: Bruce Macias  . Number of children: 1  . Years of education: N/A   Occupational History  . case worker     [employer]   Social History Main Topics  . Smoking status: Current Some Day Smoker    Packs/day: 0.33    Years: 10.00    Types: Cigarettes  . Smokeless tobacco: Never Used  . Alcohol use No  . Drug use: No  . Sexual activity: No   Other Topics Concern  . Not on file   Social History Narrative   Exercise cardio and free weights 3-4 days/week.   Lives with his wife and daughter.   Attending online-degree program (bachelor's) through [employer].   Review of Systems  Constitutional: Negative for fatigue and unexpected weight change.  HENT: Positive for dental problem.   Eyes: Negative for visual disturbance.  Respiratory: Negative for cough, chest tightness and shortness of breath.   Cardiovascular: Negative for chest pain, palpitations and leg swelling.  Gastrointestinal: Negative for abdominal pain and blood in stool.  Neurological: Negative for dizziness, light-headedness and headaches.       Objective:   Physical Exam  Constitutional: He is oriented to person, place, and time. He appears well-developed and well-nourished.  HENT:  Head: Normocephalic and atraumatic.  No facial swelling, jaw non tender; first left lower premolar without surrounding gum, edema or erythema, buccal and lingual gumline was non tender, no apparent abscess  Eyes: EOM are normal. Pupils are equal, round, and reactive to light.  Neck: No JVD present. Carotid bruit is not present.  Cardiovascular: Normal rate, regular rhythm and normal heart sounds.   No murmur Macias. Pulmonary/Chest: Effort normal and breath sounds normal. He has no rales.  Musculoskeletal: He exhibits no edema.  Lymphadenopathy:    He has no cervical adenopathy.  Neurological: He is alert and oriented to person, place, and  time.  Skin: Skin is warm and dry.  Psychiatric: He has a normal mood and affect.  Vitals reviewed.   Vitals:   04/09/16 1626  BP: (!) 190/130  Pulse: (!) 105  Resp: 20  Temp: 98.8 F (37.1 C)  TempSrc: Oral  SpO2: 95%  Weight: (!) 399 lb 6.4 oz (181.2 kg)  Height: 5' 8.5" (1.74 m)      Assessment & Plan:    Tiffany Talarico is a 36 y.o. male Essential hypertension - Plan: Basic metabolic panel, lisinopril (PRINIVIL,ZESTRIL) 10 MG tablet  - Uncontrolled off medication. Asymptomatic in office. Agreed to refill lisinopril at 10mg  with close follow-up. ER/911 precautions if any symptoms were discussed. Understanding expressed. BMP pending. Recheck in 1 week with me with outside readings.   -Not fasting, so lipids were not  checked. Plan for follow-up with primary care provider.  Tobacco abuse counseling - Plan: varenicline (CHANTIX CONTINUING MONTH PAK) 1 MG tablet, varenicline (CHANTIX STARTING MONTH PAK) 0.5 MG X 11 & 1 MG X 42 tablet  - Tolerated Chantix, restart with starter pack, other resources provided on AVS.  Severe obesity (BMI >= 40) (HCC)  - Advised to follow back up with bariatric specialist. If referral needed, let us know.  Tooth pain - Plan: amoxicillin (AMOXIL) 500 MG capsule, HYDROcodone-acetaminophen (NORCO/VICODIN) 5-325 MG tablet Dental caries - Plan: amoxicillin (AMOXIL) 500 MG capsule, HYDROcodone-acetaminophen (NORCO/VICODIN) 5-325 MG tablet  - Dental decay without apparent periapical abscess. As unable to see dentist in next 3-4 days will cover with amoxicillin, hydrocodone prescribed for breakthrough pain, short course discussed. Avoiding NSAIDs with elevated blood pressure. ER/RTC precautions.  Meds ordered this encounter  Medications  . lisinopril (PRINIVIL,ZESTRIL) 10 MG tablet    Sig: Take 1 tablet (10 mg total) by mouth daily.    Dispense:  90 tablet    Refill:  3  . varenicline (CHANTIX CONTINUING MONTH PAK) 1 MG tablet    Sig: Take 1 tablet (1 mg  total) by mouth 2 (two) times daily.    Dispense:  60 tablet    Refill:  2  . varenicline (CHANTIX STARTING MONTH PAK) 0.5 MG X 11 & 1 MG X 42 tablet    Sig: Take one 0.5 mg tablet by mouth once daily for 3 days, then increase to one 0.5 mg tablet twice daily for 4 days, then increase to one 1 mg tablet twice daily.    Dispense:  53 tablet    Refill:  0  . amoxicillin (AMOXIL) 500 MG capsule    Sig: Take 1 capsule (500 mg total) by mouth 3 (three) times daily.    Dispense:  30 capsule    Refill:  0  . HYDROcodone-acetaminophen (NORCO/VICODIN) 5-325 MG tablet    Sig: Take 1 tablet by mouth every 6 (six) hours as needed for moderate pain.    Dispense:  20 tablet    Refill:  0   Patient Instructions   Restart lisinopril at previous dose. Keep a record of your blood pressures outside of the office and bring them to the next office visit within 1 week. If any chest pains, dizziness, headaches, vision changes, or weakness - call 911 or go to the emergency room right away.   We will schedule you a physical with your primary care provider, Dr. Katrinka BlazingSmith.   Call [facility] to arrange an appointment for follow up .  [place] offers smoking cessation clinics. Registration is required. To register call [phone] or register online at [email or web address].  Start amoxicillin for dental decay, but I do not see an infection at this time. Hydrocodone up to every 6 hours as needed, avoid NSAIDs such as ibuprofen at this time due to your elevated blood pressure. Call your dentist as soon as possible for follow-up. If any increased swelling around tooth, or worsening symptoms, be seen in the emergency room    Dental Pain Dental pain may be caused by many things, including:  Tooth decay (cavities or caries). Cavities expose the nerve of your tooth to air and hot or cold temperatures. This can cause pain or discomfort.  Abscess or infection. A dental abscess is a collection of infected pus from a  bacterial infection in the inner part of the tooth (pulp). It usually occurs at the end of the tooth's root.  Injury.  An unknown reason (idiopathic). Your pain may be mild or severe. It may only occur when:  You are chewing.  You are exposed to hot or cold temperature.  You are eating or drinking sugary foods or beverages, such as soda or candy. Your pain may also be constant. Follow these instructions at home: Watch your dental pain for any changes. The following actions may help to lessen any discomfort that you are feeling:  Take medicines only as directed by your dentist.  If you were prescribed an antibiotic medicine, finish all of it even if you start to feel better.  Keep all follow-up visits as directed by your dentist. This is important.  Do not apply heat to the outside of your face.  Rinse your mouth or gargle with salt water if directed by your dentist. This helps with pain and swelling.  You can make salt water by adding  tsp of salt to 1 cup of warm water.  Apply ice to the painful area of your face:  Put ice in a plastic bag.  Place a towel between your skin and the bag.  Leave the ice on for 20 minutes, 2-3 times per day.  Avoid foods or drinks that cause you pain, such as:  Very hot or very cold foods or drinks.  Sweet or sugary foods or drinks. Contact a health care provider if:  Your pain is not controlled with medicines.  Your symptoms are worse.  You have new symptoms. Get help right away if:  You are unable to open your mouth.  You are having trouble breathing or swallowing.  You have a fever.  Your face, neck, or jaw is swollen. This information is not intended to replace advice given to you by your health care provider. Make sure you discuss any questions you have with your health care provider. Document Released: 03/15/2005 Document Revised: 07/24/2015 Document Reviewed: 03/11/2014 Elsevier Interactive Patient Education  2017  Elsevier Inc.    Hypertension Hypertension, commonly called high blood pressure, is when the force of blood pumping through your arteries is too strong. Your arteries are the blood vessels that carry blood from your heart throughout your body. A blood pressure reading consists of a higher number over a lower number, such as 110/72. The higher number (systolic) is the pressure inside your arteries when your heart pumps. The lower number (diastolic) is the pressure inside your arteries when your heart relaxes. Ideally you want your blood pressure below 120/80. Hypertension forces your heart to work harder to pump blood. Your arteries may become narrow or stiff. Having untreated or uncontrolled hypertension can cause heart attack, stroke, kidney disease, and other problems. What increases the risk? Some risk factors for high blood pressure are controllable. Others are not. Risk factors you cannot control include:  Race. You may be at higher risk if you are African American.  Age. Risk increases with age.  Gender. Men are at higher risk than women before age 57 years. After age 86, women are at higher risk than men. Risk factors you can control include:  Not getting enough exercise or physical activity.  Being overweight.  Getting too much fat, sugar, calories, or salt in your diet.  Drinking too much alcohol. What are the signs or symptoms? Hypertension does not usually cause signs or symptoms. Extremely high blood pressure (hypertensive crisis) may cause headache, anxiety, shortness of breath, and nosebleed. How is this diagnosed? To check if you have hypertension, your health care  provider will measure your blood pressure while you are seated, with your arm held at the level of your heart. It should be measured at least twice using the same arm. Certain conditions can cause a difference in blood pressure between your right and left arms. A blood pressure reading that is higher than normal  on one occasion does not mean that you need treatment. If it is not clear whether you have high blood pressure, you may be asked to return on a different day to have your blood pressure checked again. Or, you may be asked to monitor your blood pressure at home for 1 or more weeks. How is this treated? Treating high blood pressure includes making lifestyle changes and possibly taking medicine. Living a healthy lifestyle can help lower high blood pressure. You may need to change some of your habits. Lifestyle changes may include:  Following the DASH diet. This diet is high in fruits, vegetables, and whole grains. It is low in salt, red meat, and added sugars.  Keep your sodium intake below 2,300 mg per day.  Getting at least 30-45 minutes of aerobic exercise at least 4 times per week.  Losing weight if necessary.  Not smoking.  Limiting alcoholic beverages.  Learning ways to reduce stress. Your health care provider may prescribe medicine if lifestyle changes are not enough to get your blood pressure under control, and if one of the following is true:  You are 62-59 years of age and your systolic blood pressure is above 140.  You are 14 years of age or older, and your systolic blood pressure is above 150.  Your diastolic blood pressure is above 90.  You have diabetes, and your systolic blood pressure is over 140 or your diastolic blood pressure is over 90.  You have kidney disease and your blood pressure is above 140/90.  You have heart disease and your blood pressure is above 140/90. Your personal target blood pressure may vary depending on your medical conditions, your age, and other factors. Follow these instructions at home:  Have your blood pressure rechecked as directed by your health care provider.  Take medicines only as directed by your health care provider. Follow the directions carefully. Blood pressure medicines must be taken as prescribed. The medicine does not work as  well when you skip doses. Skipping doses also puts you at risk for problems.  Do not smoke.  Monitor your blood pressure at home as directed by your health care provider. Contact a health care provider if:  You think you are having a reaction to medicines taken.  You have recurrent headaches or feel dizzy.  You have swelling in your ankles.  You have trouble with your vision. Get help right away if:  You develop a severe headache or confusion.  You have unusual weakness, numbness, or feel faint.  You have severe chest or abdominal pain.  You vomit repeatedly.  You have trouble breathing. This information is not intended to replace advice given to you by your health care provider. Make sure you discuss any questions you have with your health care provider. Document Released: 03/15/2005 Document Revised: 08/21/2015 Document Reviewed: 01/05/2013 Elsevier Interactive Patient Education  2017 [employer].  Steps to Quit Smoking Smoking tobacco can be bad for your health. It can also affect almost every organ in your body. Smoking puts you and people around you at risk for many serious long-lasting (chronic) diseases. Quitting smoking is hard, but it is one of the best  things that you can do for your health. It is never too late to quit. What are the benefits of quitting smoking? When you quit smoking, you lower your risk for getting serious diseases and conditions. They can include:  Lung cancer or lung disease.  Heart disease.  Stroke.  Heart attack.  Not being able to have children (infertility).  Weak bones (osteoporosis) and broken bones (fractures). If you have coughing, wheezing, and shortness of breath, those symptoms may get better when you quit. You may also get sick less often. If you are pregnant, quitting smoking can help to lower your chances of having a baby of low birth weight. What can I do to help me quit smoking? Talk with your doctor about what can help  you quit smoking. Some things you can do (strategies) include:  Quitting smoking totally, instead of slowly cutting back how much you smoke over a period of time.  Going to in-person counseling. You are more likely to quit if you go to many counseling sessions.  Using resources and support systems, such as:  Online chats with a Veterinary surgeon.  Phone quitlines.  Printed Materials engineer.  Support groups or group counseling.  Text messaging programs.  Mobile phone apps or applications.  Taking medicines. Some of these medicines may have nicotine in them. If you are pregnant or breastfeeding, do not take any medicines to quit smoking unless your doctor says it is okay. Talk with your doctor about counseling or other things that can help you. Talk with your doctor about using more than one strategy at the same time, such as taking medicines while you are also going to in-person counseling. This can help make quitting easier. What things can I do to make it easier to quit? Quitting smoking might feel very hard at first, but there is a lot that you can do to make it easier. Take these steps:  Talk to your family and friends. Ask them to support and encourage you.  Call phone quitlines, reach out to support groups, or work with a Veterinary surgeon.  Ask people who smoke to not smoke around you.  Avoid places that make you want (trigger) to smoke, such as:  Bars.  Parties.  Smoke-break areas at work.  Spend time with people who do not smoke.  Lower the stress in your life. Stress can make you want to smoke. Try these things to help your stress:  Getting regular exercise.  Deep-breathing exercises.  Yoga.  Meditating.  Doing a body scan. To do this, close your eyes, focus on one area of your body at a time from head to toe, and notice which parts of your body are tense. Try to relax the muscles in those areas.  Download or buy apps on your mobile phone or tablet that can help you  stick to your quit plan. There are many free apps, such as QuitGuide from the [employer] developer for Disease Control and Prevention). You can find more support from smokefree.gov and other websites. This information is not intended to replace advice given to you by your health care provider. Make sure you discuss any questions you have with your health care provider. Document Released: 01/09/2009 Document Revised: 11/11/2015 Document Reviewed: 07/30/2014 Elsevier Interactive Patient Education  2017 [employer].     IF you received an x-ray today, you will receive an invoice from [facility]. Please contact [facility] at [phone] with questions or concerns regarding your invoice.   IF you received labwork  today, you will receive an invoice from [facility]. Please contact [facility] at [phone] with questions or concerns regarding your invoice.   Our billing staff will not be able to assist you with questions regarding bills from these companies.  You will be contacted with the lab results as soon as they are available. The fastest way to get your results is to activate your My Chart account. Instructions are located on the last page of this paperwork. If you have not Macias from [place] regarding the results in 2 weeks, please contact this office.        I personally performed the services described in this documentation, which was scribed in my presence. The recorded information has been reviewed and considered, and addended by me as needed.   Signed,   Bruce Staggers, MD [facility] at [facility].  04/09/16 7:00 PM

## 2016-04-09 NOTE — Patient Instructions (Addendum)
Restart lisinopril at previous dose. Keep a record of your blood pressures outside of the office and bring them to the next office visit within 1 week. If any chest pains, dizziness, headaches, vision changes, or weakness - call 911 or go to the emergency room right away.   We will schedule you a physical with your primary care provider, Dr. Katrinka Blazing.   Call [facility] to arrange an appointment for follow up .  [place] offers smoking cessation clinics. Registration is required. To register call [phone] or register online at [email or web address].  Start amoxicillin for dental decay, but I do not see an infection at this time. Hydrocodone up to every 6 hours as needed, avoid NSAIDs such as ibuprofen at this time due to your elevated blood pressure. Call your dentist as soon as possible for follow-up. If any increased swelling around tooth, or worsening symptoms, be seen in the emergency room    Dental Pain Dental pain may be caused by many things, including:  Tooth decay (cavities or caries). Cavities expose the nerve of your tooth to air and hot or cold temperatures. This can cause pain or discomfort.  Abscess or infection. A dental abscess is a collection of infected pus from a bacterial infection in the inner part of the tooth (pulp). It usually occurs at the end of the tooth's root.  Injury.  An unknown reason (idiopathic). Your pain may be mild or severe. It may only occur when:  You are chewing.  You are exposed to hot or cold temperature.  You are eating or drinking sugary foods or beverages, such as soda or candy. Your pain may also be constant. Follow these instructions at home: Watch your dental pain for any changes. The following actions may help to lessen any discomfort that you are feeling:  Take medicines only as directed by your dentist.  If you were prescribed an antibiotic medicine, finish all of it even if you start to feel better.  Keep all follow-up visits  as directed by your dentist. This is important.  Do not apply heat to the outside of your face.  Rinse your mouth or gargle with salt water if directed by your dentist. This helps with pain and swelling.  You can make salt water by adding  tsp of salt to 1 cup of warm water.  Apply ice to the painful area of your face:  Put ice in a plastic bag.  Place a towel between your skin and the bag.  Leave the ice on for 20 minutes, 2-3 times per day.  Avoid foods or drinks that cause you pain, such as:  Very hot or very cold foods or drinks.  Sweet or sugary foods or drinks. Contact a health care provider if:  Your pain is not controlled with medicines.  Your symptoms are worse.  You have new symptoms. Get help right away if:  You are unable to open your mouth.  You are having trouble breathing or swallowing.  You have a fever.  Your face, neck, or jaw is swollen. This information is not intended to replace advice given to you by your health care provider. Make sure you discuss any questions you have with your health care provider. Document Released: 03/15/2005 Document Revised: 07/24/2015 Document Reviewed: 03/11/2014 Elsevier Interactive Patient Education  2017 [employer] Inc.    Hypertension Hypertension, commonly called high blood pressure, is when the force of blood pumping through your arteries is too strong. Your arteries are the blood vessels  that carry blood from your heart throughout your body. A blood pressure reading consists of a higher number over a lower number, such as 110/72. The higher number (systolic) is the pressure inside your arteries when your heart pumps. The lower number (diastolic) is the pressure inside your arteries when your heart relaxes. Ideally you want your blood pressure below 120/80. Hypertension forces your heart to work harder to pump blood. Your arteries may become narrow or stiff. Having untreated or uncontrolled hypertension can cause  heart attack, stroke, kidney disease, and other problems. What increases the risk? Some risk factors for high blood pressure are controllable. Others are not. Risk factors you cannot control include:  Race. You may be at higher risk if you are African American.  Age. Risk increases with age.  Gender. Men are at higher risk than women before age 38 years. After age 38, women are at higher risk than men. Risk factors you can control include:  Not getting enough exercise or physical activity.  Being overweight.  Getting too much fat, sugar, calories, or salt in your diet.  Drinking too much alcohol. What are the signs or symptoms? Hypertension does not usually cause signs or symptoms. Extremely high blood pressure (hypertensive crisis) may cause headache, anxiety, shortness of breath, and nosebleed. How is this diagnosed? To check if you have hypertension, your health care provider will measure your blood pressure while you are seated, with your arm held at the level of your heart. It should be measured at least twice using the same arm. Certain conditions can cause a difference in blood pressure between your right and left arms. A blood pressure reading that is higher than normal on one occasion does not mean that you need treatment. If it is not clear whether you have high blood pressure, you may be asked to return on a different day to have your blood pressure checked again. Or, you may be asked to monitor your blood pressure at home for 1 or more weeks. How is this treated? Treating high blood pressure includes making lifestyle changes and possibly taking medicine. Living a healthy lifestyle can help lower high blood pressure. You may need to change some of your habits. Lifestyle changes may include:  Following the DASH diet. This diet is high in fruits, vegetables, and whole grains. It is low in salt, red meat, and added sugars.  Keep your sodium intake below 2,300 mg per  day.  Getting at least 30-45 minutes of aerobic exercise at least 4 times per week.  Losing weight if necessary.  Not smoking.  Limiting alcoholic beverages.  Learning ways to reduce stress. Your health care provider may prescribe medicine if lifestyle changes are not enough to get your blood pressure under control, and if one of the following is true:  You are 2418-10159 years of age and your systolic blood pressure is above 140.  You are 38 years of age or older, and your systolic blood pressure is above 150.  Your diastolic blood pressure is above 90.  You have diabetes, and your systolic blood pressure is over 140 or your diastolic blood pressure is over 90.  You have kidney disease and your blood pressure is above 140/90.  You have heart disease and your blood pressure is above 140/90. Your personal target blood pressure may vary depending on your medical conditions, your age, and other factors. Follow these instructions at home:  Have your blood pressure rechecked as directed by your health care provider.  Take medicines only as directed by your health care provider. Follow the directions carefully. Blood pressure medicines must be taken as prescribed. The medicine does not work as well when you skip doses. Skipping doses also puts you at risk for problems.  Do not smoke.  Monitor your blood pressure at home as directed by your health care provider. Contact a health care provider if:  You think you are having a reaction to medicines taken.  You have recurrent headaches or feel dizzy.  You have swelling in your ankles.  You have trouble with your vision. Get help right away if:  You develop a severe headache or confusion.  You have unusual weakness, numbness, or feel faint.  You have severe chest or abdominal pain.  You vomit repeatedly.  You have trouble breathing. This information is not intended to replace advice given to you by your health care provider. Make  sure you discuss any questions you have with your health care provider. Document Released: 03/15/2005 Document Revised: 08/21/2015 Document Reviewed: 01/05/2013 Elsevier Interactive Patient Education  2017 [employer].  Steps to Quit Smoking Smoking tobacco can be bad for your health. It can also affect almost every organ in your body. Smoking puts you and people around you at risk for many serious long-lasting (chronic) diseases. Quitting smoking is hard, but it is one of the best things that you can do for your health. It is never too late to quit. What are the benefits of quitting smoking? When you quit smoking, you lower your risk for getting serious diseases and conditions. They can include:  Lung cancer or lung disease.  Heart disease.  Stroke.  Heart attack.  Not being able to have children (infertility).  Weak bones (osteoporosis) and broken bones (fractures). If you have coughing, wheezing, and shortness of breath, those symptoms may get better when you quit. You may also get sick less often. If you are pregnant, quitting smoking can help to lower your chances of having a baby of low birth weight. What can I do to help me quit smoking? Talk with your doctor about what can help you quit smoking. Some things you can do (strategies) include:  Quitting smoking totally, instead of slowly cutting back how much you smoke over a period of time.  Going to in-person counseling. You are more likely to quit if you go to many counseling sessions.  Using resources and support systems, such as:  Online chats with a Veterinary surgeon.  Phone quitlines.  Printed Materials engineer.  Support groups or group counseling.  Text messaging programs.  Mobile phone apps or applications.  Taking medicines. Some of these medicines may have nicotine in them. If you are pregnant or breastfeeding, do not take any medicines to quit smoking unless your doctor says it is okay. Talk with your doctor  about counseling or other things that can help you. Talk with your doctor about using more than one strategy at the same time, such as taking medicines while you are also going to in-person counseling. This can help make quitting easier. What things can I do to make it easier to quit? Quitting smoking might feel very hard at first, but there is a lot that you can do to make it easier. Take these steps:  Talk to your family and friends. Ask them to support and encourage you.  Call phone quitlines, reach out to support groups, or work with a Veterinary surgeon.  Ask people who smoke to not smoke around you.  Avoid places that make you want (trigger) to smoke, such as:  Bars.  Parties.  Smoke-break areas at work.  Spend time with people who do not smoke.  Lower the stress in your life. Stress can make you want to smoke. Try these things to help your stress:  Getting regular exercise.  Deep-breathing exercises.  Yoga.  Meditating.  Doing a body scan. To do this, close your eyes, focus on one area of your body at a time from head to toe, and notice which parts of your body are tense. Try to relax the muscles in those areas.  Download or buy apps on your mobile phone or tablet that can help you stick to your quit plan. There are many free apps, such as QuitGuide from the [employer] developer for Disease Control and Prevention). You can find more support from smokefree.gov and other websites. This information is not intended to replace advice given to you by your health care provider. Make sure you discuss any questions you have with your health care provider. Document Released: 01/09/2009 Document Revised: 11/11/2015 Document Reviewed: 07/30/2014 Elsevier Interactive Patient Education  2017 [employer].     IF you received an x-ray today, you will receive an invoice from [facility]. Please contact [facility] at [phone] with questions or concerns regarding your invoice.    IF you received labwork today, you will receive an invoice from [place]. Please contact [facility] at [phone] with questions or concerns regarding your invoice.   Our billing staff will not be able to assist you with questions regarding bills from these companies.  You will be contacted with the lab results as soon as they are available. The fastest way to get your results is to activate your My Chart account. Instructions are located on the last page of this paperwork. If you have not heard from [place] regarding the results in 2 weeks, please contact this office.

## 2016-04-10 LAB — SPECIMEN STATUS

## 2016-04-12 LAB — BASIC METABOLIC PANEL
BUN/Creatinine Ratio: 15 (ref 9–20)
BUN: 12 mg/dL (ref 6–20)
CO2: 25 mmol/L (ref 18–29)
Calcium: 9.9 mg/dL (ref 8.7–10.2)
Chloride: 98 mmol/L (ref 96–106)
Creatinine, Ser: 0.79 mg/dL (ref 0.76–1.27)
GFR calc Af Amer: 133 mL/min/{1.73_m2} (ref >59)
GFR calc non Af Amer: 115 mL/min/{1.73_m2} (ref >59)
Glucose: 93 mg/dL (ref 65–99)
Potassium: 4.2 mmol/L (ref 3.5–5.2)
SODIUM: 140 mmol/L (ref 134–144)

## 2016-05-13 DIAGNOSIS — K5732 Diverticulitis of large intestine without perforation or abscess without bleeding: Secondary | ICD-10-CM | POA: Diagnosis not present

## 2016-07-17 ENCOUNTER — Other Ambulatory Visit: Payer: Self-pay | Admitting: Family Medicine

## 2016-07-17 DIAGNOSIS — Z716 Tobacco abuse counseling: Secondary | ICD-10-CM

## 2016-07-19 NOTE — Telephone Encounter (Signed)
Typically Chantix is dosed for 11 weeks, but can be extended for up to an additional 3 months. Those refills were provided. If further need beyond that time, return to discuss other options.

## 2016-10-23 ENCOUNTER — Other Ambulatory Visit: Payer: Self-pay | Admitting: Family Medicine

## 2016-10-23 DIAGNOSIS — Z716 Tobacco abuse counseling: Secondary | ICD-10-CM

## 2016-11-07 ENCOUNTER — Other Ambulatory Visit: Payer: Self-pay | Admitting: Family Medicine

## 2016-11-07 DIAGNOSIS — Z716 Tobacco abuse counseling: Secondary | ICD-10-CM

## 2017-02-23 ENCOUNTER — Other Ambulatory Visit: Payer: Self-pay | Admitting: Family Medicine

## 2017-02-23 DIAGNOSIS — I10 Essential (primary) hypertension: Secondary | ICD-10-CM

## 2017-02-23 NOTE — Telephone Encounter (Signed)
Greater than 6 months since last office visit; does pt need to be seen before refill can be granted

## 2017-05-05 DIAGNOSIS — H5213 Myopia, bilateral: Secondary | ICD-10-CM | POA: Diagnosis not present

## 2017-05-28 ENCOUNTER — Other Ambulatory Visit: Payer: Self-pay | Admitting: Family Medicine

## 2017-05-28 DIAGNOSIS — I10 Essential (primary) hypertension: Secondary | ICD-10-CM

## 2017-05-30 NOTE — Telephone Encounter (Signed)
No recent OV.  LOV 04/09/16 / Refill request for Lisinopril .

## 2017-06-11 ENCOUNTER — Other Ambulatory Visit: Payer: Self-pay | Admitting: Family Medicine

## 2017-06-11 DIAGNOSIS — I10 Essential (primary) hypertension: Secondary | ICD-10-CM

## 2017-06-18 ENCOUNTER — Other Ambulatory Visit: Payer: Self-pay | Admitting: Family Medicine

## 2017-06-18 DIAGNOSIS — I10 Essential (primary) hypertension: Secondary | ICD-10-CM

## 2017-06-20 NOTE — Telephone Encounter (Signed)
Refusing Lisinopril refill request from pharmacy due to needs an office visit. TC to patient to schedule appointment.Left VM to return call.

## 2017-07-04 DIAGNOSIS — M25561 Pain in right knee: Secondary | ICD-10-CM | POA: Diagnosis not present

## 2017-07-04 DIAGNOSIS — M25562 Pain in left knee: Secondary | ICD-10-CM | POA: Diagnosis not present

## 2017-07-13 ENCOUNTER — Encounter: Payer: Self-pay | Admitting: Physician Assistant

## 2017-07-13 ENCOUNTER — Other Ambulatory Visit: Payer: Self-pay

## 2017-07-13 ENCOUNTER — Ambulatory Visit (INDEPENDENT_AMBULATORY_CARE_PROVIDER_SITE_OTHER): Payer: 59 | Admitting: Physician Assistant

## 2017-07-13 VITALS — BP 122/98 | HR 97 | Temp 98.5°F | Resp 16 | Ht 69.0 in | Wt 372.4 lb

## 2017-07-13 DIAGNOSIS — R739 Hyperglycemia, unspecified: Secondary | ICD-10-CM

## 2017-07-13 DIAGNOSIS — Z6841 Body Mass Index (BMI) 40.0 and over, adult: Secondary | ICD-10-CM

## 2017-07-13 DIAGNOSIS — Z716 Tobacco abuse counseling: Secondary | ICD-10-CM | POA: Diagnosis not present

## 2017-07-13 DIAGNOSIS — I10 Essential (primary) hypertension: Secondary | ICD-10-CM

## 2017-07-13 DIAGNOSIS — E785 Hyperlipidemia, unspecified: Secondary | ICD-10-CM

## 2017-07-13 DIAGNOSIS — Z130 Encounter for screening for diseases of the blood and blood-forming organs and certain disorders involving the immune mechanism: Secondary | ICD-10-CM

## 2017-07-13 DIAGNOSIS — Z13228 Encounter for screening for other metabolic disorders: Secondary | ICD-10-CM | POA: Diagnosis not present

## 2017-07-13 DIAGNOSIS — F439 Reaction to severe stress, unspecified: Secondary | ICD-10-CM | POA: Diagnosis not present

## 2017-07-13 DIAGNOSIS — K760 Fatty (change of) liver, not elsewhere classified: Secondary | ICD-10-CM | POA: Diagnosis not present

## 2017-07-13 DIAGNOSIS — F17200 Nicotine dependence, unspecified, uncomplicated: Secondary | ICD-10-CM | POA: Diagnosis not present

## 2017-07-13 DIAGNOSIS — E119 Type 2 diabetes mellitus without complications: Secondary | ICD-10-CM

## 2017-07-13 DIAGNOSIS — K5732 Diverticulitis of large intestine without perforation or abscess without bleeding: Secondary | ICD-10-CM | POA: Insufficient documentation

## 2017-07-13 DIAGNOSIS — Z0000 Encounter for general adult medical examination without abnormal findings: Secondary | ICD-10-CM

## 2017-07-13 DIAGNOSIS — L0592 Pilonidal sinus without abscess: Secondary | ICD-10-CM

## 2017-07-13 DIAGNOSIS — G4730 Sleep apnea, unspecified: Secondary | ICD-10-CM

## 2017-07-13 MED ORDER — LISINOPRIL 10 MG PO TABS: 10.0000 mg | ORAL_TABLET | Freq: Every day | ORAL | 3 refills | Status: AC

## 2017-07-13 MED ORDER — BUPROPION HCL ER (XL) 150 MG PO TB24: 150.0000 mg | ORAL_TABLET | Freq: Every day | ORAL | 3 refills | Status: AC

## 2017-07-13 MED ORDER — VARENICLINE TARTRATE 1 MG PO TABS: 1.0000 mg | ORAL_TABLET | Freq: Two times a day (BID) | ORAL | 5 refills | Status: DC

## 2017-07-13 NOTE — Assessment & Plan Note (Signed)
High risk patient. Suspect hypoventilation syndrome due to obesity. Refer to [facility]. Continue CPAP.

## 2017-07-13 NOTE — Assessment & Plan Note (Signed)
Resume Chantix.

## 2017-07-13 NOTE — Assessment & Plan Note (Signed)
Will contact his CPAP supplier to order new tubing.

## 2017-07-13 NOTE — Patient Instructions (Addendum)
CHANTIX Instructions: Select a QUIT date at least 7 days in the future. You should be taking the Chantix for at least 7 days before you stop smoking. Days 1-4: take 1/2 tablet (0.5 mg) each evening Days 5-8: take 1/2 tablet (0.5 mg) each morning and each evening Days 9-12: take 1/2 tablet (0.5 mg) each morning and 1 tablet (1 mg) each evening Days 13-on: Take 1 tablet (1 mg) each morning and each evening     IF you received an x-ray today, you will receive an invoice from [facility]. Please contact [facility] at [phone] with questions or concerns regarding your invoice.   IF you received labwork today, you will receive an invoice from [facility]. Please contact [facility] at [phone] with questions or concerns regarding your invoice.   Our billing staff will not be able to assist you with questions regarding bills from these companies.  You will be contacted with the lab results as soon as they are available. The fastest way to get your results is to activate your My Chart account. Instructions are located on the last page of this paperwork. If you have not heard from us regarding the results in 2 weeks, please contact this office.     

## 2017-07-13 NOTE — Assessment & Plan Note (Signed)
Update liver enzymes. Work on healthy lifestyle changes and weight loss.

## 2017-07-13 NOTE — Assessment & Plan Note (Signed)
Await labs. Consider statin. Healthy lifestyle changes encouraged.

## 2017-07-13 NOTE — Progress Notes (Signed)
Patient ID: Bruce Macias, male    DOB: 07/31/1980, 37 y.o.   MRN: [id number]  PCP: Ethelda ChickSmith, Kristi M, MD  Chief Complaint  Patient presents with  . Annual Exam    Subjective:   Presents for [facility]. I last saw him in 2016, also for wellness. He was here 03/2016, with one of my colleagues, for HTN evaluation.  He is working 3 jobs, and taking classes. Hopes to complete his degree in the next 2 years. None of his jobs pays enough to support his family's lifestyle. [place] and lives there with their daughter. [place], and they talk via Skype, What's App, etc.  His vaccines are current. Not yet a candidate for PSA, colonoscopy.  Uses CPAP for PSA. Needs new tubing. Set up is through [facility].  He requests prescriptions for Valium and Adderall, to help him manage the stress of his life and help him concentrate. He also requests a refill of Chantix. He has cut back on smoking using Chantix previously, but continues to smoke. Saw general surgery to discuss excision of the Pilonidal fistula, but isn't able to take off 6 weeks from work to recover, so he continues to manage the drainage from this at home. He also saw a bariatric surgeon and qualified for surgery, but recognized that he engages in a lot of emotional eating, so would likely gain the weight back, and so never scheduled.  Review of Systems Constitutional: Positive for fatigue.  HENT: Negative.   Eyes: Negative.   Respiratory: Positive for chest tightness.   Cardiovascular: Negative for chest pain and palpitations.  Gastrointestinal: Negative for diarrhea, nausea and vomiting.  Endocrine: Negative for polydipsia and polyuria.  Genitourinary: Negative for difficulty urinating, dysuria and hematuria.  Musculoskeletal: Positive for neck pain ("slept wrong"). Negative for arthralgias, back pain and myalgias.  Skin: Negative.     Allergic/Immunologic: Negative for environmental allergies.  Neurological: Negative for dizziness, light-headedness and headaches.  Hematological: Negative.   Psychiatric/Behavioral: Negative.    [facility] 07/13/2017 04/09/2016 02/05/2015  Decreased Interest 0 0 0  Down, Depressed, Hopeless 0 0 0  PHQ - 2 Score 0 0 0     Patient Active Problem List   Diagnosis Date Noted  . 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 Hepatic steatosis 02/05/2015  . HTN (hypertension) 05/14/2011  . Obesity 05/14/2011  . Hyperlipidemia 05/14/2011  . OSA (obstructive sleep apnea) 05/14/2011    Past Medical History:  Diagnosis Date  . Boil 06/2010   LEFT inguinal  . Condyloma acuminatum of perianal region 06/2009   treated with imiquimod and then dermatology evaluation  . Erectile dysfunction   . Hypertension   . Psoriasis   . Vitamin D deficiency 2011   took vit D 50,000 IU weekly for brief period     Prior to Admission medications   Medication Sig Start Date End Date Taking? Authorizing Provider  lisinopril (PRINIVIL,ZESTRIL) 10 MG tablet Take 1 tablet (10 mg total) by mouth daily. Office visit needed for 90 day supply 02/24/17  Yes Shade FloodGreene, Jeffrey R, MD  Multiple Vitamin (MULTIVITAMIN WITH MINERALS) TABS tablet Take 1 tablet by mouth daily.   Yes [provider]  CHANTIX 1 MG tablet TAKE 1 TABLET (1 MG TOTAL) BY MOUTH 2 (TWO) TIMES DAILY. Patient not taking: Reported on 07/13/2017 07/19/16   Shade FloodGreene, Jeffrey R, MD  HYDROcodone-acetaminophen (NORCO/VICODIN) 5-325 MG tablet  Take 1 tablet by mouth every 6 (six) hours as needed for moderate pain. Patient not taking: Reported on 07/13/2017 04/09/16   Shade Flood, MD  varenicline (CHANTIX STARTING MONTH PAK) 0.5 MG X 11 & 1 MG X 42 tablet Take one 0.5 mg tablet by mouth once daily for 3 days, then increase to one 0.5 mg tablet twice daily for 4 days, then increase to one 1 mg tablet twice daily. Patient not taking: Reported on  07/13/2017 04/09/16   Shade Flood, MD    No Known Allergies  History reviewed. No pertinent surgical history.  Family History  Problem Relation Age of Onset  . Cancer Father     Social History   Socioeconomic History  . Marital status: Married    Spouse name: Bruce Macias  . Number of children: 1  . Years of education: Not on file  . Highest education level: Not on file  Occupational History  . Occupation: case worker    Comment: [employer]  . Financial resource strain: Not on file  . Food insecurity:    Worry: Not on file    Inability: Not on file  . Transportation needs:    Medical: Not on file    Non-medical: Not on file  Tobacco Use  . Smoking status: Current Some Day Smoker    Packs/day: 0.33    Years: 10.00    Pack years: 3.30    Types: Cigarettes  . Smokeless tobacco: Never Used  Substance and Sexual Activity  . Alcohol use: No    Alcohol/week: 0.0 oz  . Drug use: No  . Sexual activity: Never  Lifestyle  . Physical activity:    Days per week: Not on file    Minutes per session: Not on file  . Stress: Not on file  Relationships  . Social connections:    Talks on phone: Not on file    Gets together: Not on file    Attends religious service: Not on file    Active member of club or organization: Not on file    Attends meetings of clubs or organizations: Not on file    Relationship status: Not on file  Other Topics Concern  . Not on file  Social History Narrative   Exercise cardio and free weights 3-4 days/week.   Lives with his wife and daughter.   Attending online-degree program (bachelor's) through [employer].        Objective:  Physical Exam  Constitutional: He is oriented to person, place, and time. He appears well-developed and well-nourished. He is active and cooperative.  Non-toxic appearance. He does not have a sickly appearance. He does not appear ill. No distress.  BP (!) 122/98   Pulse 97   Temp 98.5 F (36.9 C)   Resp  16   Ht 5\' 9"  (1.753 m)   Wt (!) 372 lb 6.4 oz (168.9 kg)   SpO2 96%   BMI 54.99 kg/m    HENT:  Head: Normocephalic and atraumatic.  Right Ear: Hearing, tympanic membrane, external ear and ear canal normal.  Left Ear: Hearing, tympanic membrane, external ear and ear canal normal.  Nose: Nose normal.  Mouth/Throat: Uvula is midline, oropharynx is clear and moist and mucous membranes are normal. He does not have dentures. No oral lesions. No trismus in the jaw. Normal dentition. No dental abscesses, uvula swelling, lacerations or dental caries.  Eyes: Pupils are equal, round, and reactive to light. Conjunctivae, EOM and lids  are normal. Right eye exhibits no discharge. Left eye exhibits no discharge. No scleral icterus.  Fundoscopic exam:      The right eye shows no arteriolar narrowing, no AV nicking, no exudate, no hemorrhage and no papilledema.       The left eye shows no arteriolar narrowing, no AV nicking, no exudate, no hemorrhage and no papilledema.  Neck: Normal range of motion, full passive range of motion without pain and phonation normal. Neck supple. No spinous process tenderness and no muscular tenderness present. No neck rigidity. No tracheal deviation, no edema, no erythema and normal range of motion present. No thyromegaly present.  Cardiovascular: Normal rate, regular rhythm, S1 normal, S2 normal, normal heart sounds, intact distal pulses and normal pulses. Exam reveals no gallop and no friction rub.  No murmur heard. Pulmonary/Chest: Effort normal. No respiratory distress. He has wheezes (bibasilar, soft). He has no rales.  Abdominal: Soft. Normal appearance and bowel sounds are normal. He exhibits no distension and no mass. There is no hepatosplenomegaly. There is no tenderness. There is no rebound and no guarding. No hernia.  Musculoskeletal: Normal range of motion. He exhibits no edema or tenderness.       Cervical back: Normal. He exhibits normal range of motion, no  tenderness, no bony tenderness, no swelling, no edema, no deformity, no laceration, no pain, no spasm and normal pulse.       Thoracic back: Normal.       Lumbar back: Normal.  Lymphadenopathy:       Head (right side): No submental, no submandibular, no tonsillar, no preauricular, no posterior auricular and no occipital adenopathy present.       Head (left side): No submental, no submandibular, no tonsillar, no preauricular, no posterior auricular and no occipital adenopathy present.    He has no cervical adenopathy.       Right: No supraclavicular adenopathy present.       Left: No supraclavicular adenopathy present.  Neurological: He is alert and oriented to person, place, and time. He has normal strength and normal reflexes. He displays no tremor and normal reflexes. No cranial nerve deficit. He exhibits normal muscle tone. Coordination and gait normal.  Skin: Skin is warm, dry and intact. No abrasion, no ecchymosis, no laceration, no lesion and no rash noted. He is not diaphoretic. No cyanosis or erythema. No pallor. Nails show no clubbing.  Psychiatric: He has a normal mood and affect. His speech is normal and behavior is normal. Judgment and thought content normal. Cognition and memory are normal.     Wt Readings from Last 3 Encounters:  07/13/17 (!) 372 lb 6.4 oz (168.9 kg)  04/09/16 (!) 399 lb 6.4 oz (181.2 kg)  02/05/15 (!) 382 lb 6.4 oz (173.5 kg)     Visual Acuity Screening   Right eye Left eye Both eyes  Without correction:     With correction: 20/20 20/20 20/25          Assessment & Plan:   Problem List Items Addressed This Visit    Hypertension    Diastolic pressure is elevated. Out of lisinopril x 2 weeks. Resume. Healthy lifestyle changes encouraged. Continue CPAP.      Relevant Medications   lisinopril (PRINIVIL,ZESTRIL) 10 MG tablet   Other Relevant Orders   CBC with Differential/Platelet   Comprehensive metabolic panel   TSH   BMI 50.0-59.9, adult (HCC)     High risk patient. Suspect hypoventilation syndrome due to obesity. Refer to [facility]. Continue CPAP.  Relevant Orders   Amb Ref to Medical Weight Management   Hyperlipidemia    Await labs. Consider statin. Healthy lifestyle changes encouraged.       Relevant Medications   lisinopril (PRINIVIL,ZESTRIL) 10 MG tablet   Sleep apnea with use of continuous positive airway pressure (CPAP)    Will contact his CPAP supplier to order new tubing.      Pilonidal fistula    Stable. Unable to take the time from work/school to recover from proposed resection. Continue home management until he can make arrangements for time off, likely to be 3 years.      Hepatic steatosis    Update liver enzymes. Work on healthy lifestyle changes and weight loss.      Smoker    Resume Chantix.      Situational stress    Due to over-scheduled lifestyle. Declined request for Ativan and Adderall. Recommend bupropion XL, which can help his mood, improve concentration/focus, and minimize emotional eating.      Relevant Medications   buPROPion (WELLBUTRIN XL) 150 MG 24 hr tablet    Other Visit Diagnoses    Annual physical exam    -  Primary   Age appropriate health guidance provided.   Screening for metabolic disorder       Relevant Orders   Lipid panel   Urinalysis, dipstick only   Comprehensive metabolic panel   Screening for iron deficiency anemia       Relevant Orders   CBC with Differential/Platelet   Tobacco abuse counseling       Relevant Medications   varenicline (CHANTIX) 1 MG tablet       Return in about 1 month (around 08/10/2017) for re-evalaution of blood pressure, mood, smoking.   Fernande Bras, PA-C [facility]

## 2017-07-13 NOTE — Assessment & Plan Note (Signed)
Stable. Unable to take the time from work/school to recover from proposed resection. Continue home management until he can make arrangements for time off, likely to be 3 years.

## 2017-07-13 NOTE — Assessment & Plan Note (Signed)
Due to over-scheduled lifestyle. Declined request for Ativan and Adderall. Recommend bupropion XL, which can help his mood, improve concentration/focus, and minimize emotional eating.

## 2017-07-13 NOTE — Assessment & Plan Note (Signed)
Diastolic pressure is elevated. Out of lisinopril x 2 weeks. Resume. Healthy lifestyle changes encouraged. Continue CPAP.

## 2017-07-13 NOTE — Progress Notes (Signed)
Subjective:    Patient ID: Bruce Macias, male    DOB: 16-Jun-1980, 37 y.o.   MRN: [id number] Chief Complaint  Patient presents with  . Annual Exam   HPI  37 yo male presents for annual physical exam.   HTN: Reports running out of lisinopril 10mg  2 weeks ago, was taking daily.   Smoking cessation: Reports previously taking chantix 1mg  with not enough relief or assistance with smoking cessation.   Reports smoking 1 pack per week or sometimes a cigar every few days.  Stress: Reports a lot of stress at work, has 3 jobs and is having trouble focusing. 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 Other 2 jobs consist of retail.  Patient asking for assistance with managing his stress, such as valium and adderall.    Shoulder/neck pain : Complains of right sided shoulder pain that started yesterday AM. "I think I slept on it wrong". Pain exacerbated with inspiration. Has been taking tylenol, which has helped throughout the day. Reports stacking up his pillows when sleep which may have caused his neck to stretch too much overnight.  Pilonidal cyst: Reports pilonidal cyst still drains occasionally but not as much as it used to. Previously referred for surgery but unable to take off work/school for the amount of time required for recovery. Reports self caring for it with gauze and allowing it to drain on its own. Denies any current pain.  Diverticulitis: Reports no complaints. Eating a lot more greens. "Cut out all the foods that I shouldn't eat, like corn"  OSA: Needs more supplies for his machine (tubing), reports not snoring as much. Sleeps 5-6 hours/night. Feels fatigued this AM, but reports feeling good on most days.    Reports his daughter is currently living with [place] in [place], [place] for a new job. He sees his daughter and wife 1-2 weekends/month.  Review of Systems  Constitutional: Positive  for fatigue.  HENT: Negative.   Eyes: Negative.   Respiratory: Positive for chest tightness.   Cardiovascular: Negative for chest pain and palpitations.  Gastrointestinal: Negative for diarrhea, nausea and vomiting.  Endocrine: Negative for polydipsia and polyuria.  Genitourinary: Negative for difficulty urinating, dysuria and hematuria.  Musculoskeletal: Positive for neck pain ("slept wrong"). Negative for arthralgias, back pain and myalgias.  Skin: Negative.   Allergic/Immunologic: Negative for environmental allergies.  Neurological: Negative for dizziness, light-headedness and headaches.  Hematological: Negative.   Psychiatric/Behavioral: Negative.     Patient Active Problem List   Diagnosis Date Noted  . 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 Hepatic steatosis 02/05/2015  . HTN (hypertension) 05/14/2011  . Obesity 05/14/2011  . Hyperlipidemia 05/14/2011  . OSA (obstructive sleep apnea) 05/14/2011   Past Medical History:  Diagnosis Date  . Boil 06/2010   LEFT inguinal  . Condyloma acuminatum of perianal region 06/2009   treated with imiquimod and then dermatology evaluation  . Erectile dysfunction   . Hypertension   . Psoriasis   . Vitamin D deficiency 2011   took vit D 50,000 IU weekly for brief period   Prior to Admission medications   Medication Sig Start Date End Date Taking? Authorizing Provider  lisinopril (PRINIVIL,ZESTRIL) 10 MG tablet Take 1 tablet (10 mg total) by mouth daily. Office visit needed for 90 day supply 02/24/17  Yes Shade Flood, MD  Multiple Vitamin (MULTIVITAMIN WITH MINERALS) TABS tablet Take 1 tablet by  mouth daily.   Yes [provider]  amoxicillin (AMOXIL) 500 MG capsule Take 1 capsule (500 mg total) by mouth 3 (three) times daily. Patient not taking: Reported on 07/13/2017 04/09/16   Shade Flood, MD  CHANTIX 1 MG tablet TAKE 1 TABLET (1 MG TOTAL) BY MOUTH 2 (TWO) TIMES DAILY. Patient not taking: Reported on 07/13/2017 07/19/16    Shade Flood, MD  HYDROcodone-acetaminophen (NORCO/VICODIN) 5-325 MG tablet Take 1 tablet by mouth every 6 (six) hours as needed for moderate pain. Patient not taking: Reported on 07/13/2017 04/09/16   Shade Flood, MD  varenicline (CHANTIX STARTING MONTH PAK) 0.5 MG X 11 & 1 MG X 42 tablet Take one 0.5 mg tablet by mouth once daily for 3 days, then increase to one 0.5 mg tablet twice daily for 4 days, then increase to one 1 mg tablet twice daily. Patient not taking: Reported on 07/13/2017 04/09/16   Shade Flood, MD   No Known Allergies      Objective:   Physical Exam  Constitutional: He is oriented to person, place, and time. He appears well-developed and well-nourished. No distress.  BP (!) 122/98   Pulse 97   Temp 98.5 F (36.9 C)   Resp 16   Ht 5\' 9"  (1.753 m)   Wt (!) 372 lb 6.4 oz (168.9 kg)   SpO2 96%   BMI 54.99 kg/m    HENT:  Head: Normocephalic and atraumatic.  Nose: Nose normal.  Eyes: Pupils are equal, round, and reactive to light. Conjunctivae and EOM are normal. Right eye exhibits no discharge. Left eye exhibits no discharge.  Neck: Normal range of motion. Neck supple. No tracheal deviation present. No thyromegaly present.    Cardiovascular: Normal rate, regular rhythm and intact distal pulses. Exam reveals no gallop and no friction rub.  No murmur heard. Pulses:      Radial pulses are 2+ on the right side, and 2+ on the left side.       Dorsalis pedis pulses are 1+ on the right side, and 1+ on the left side.       Posterior tibial pulses are 1+ on the right side, and 1+ on the left side.  Pulmonary/Chest: Effort normal. No accessory muscle usage. No respiratory distress. He has no decreased breath sounds. He has wheezes in the right upper field, the left upper field, the left middle field and the left lower field. He has no rhonchi. He has no rales. He exhibits no tenderness.  Abdominal: Soft. Bowel sounds are normal. He exhibits distension. He  exhibits no mass. There is no tenderness. There is no rebound and no guarding.  Musculoskeletal: Normal range of motion. He exhibits tenderness (Right lateral neck, Trapezius). He exhibits no edema.  Neurological: He is alert and oriented to person, place, and time. He has normal reflexes.  Skin: Skin is warm and dry. He is not diaphoretic. No erythema.  Psychiatric: He has a normal mood and affect. His behavior is normal.      Assessment & Plan:  1. Annual physical exam Age specific anticipatory guidance provided.  2. Screening for metabolic disorder Await labs and f/u, adjust/add meds. - Lipid panel - Urinalysis, dipstick only - Comprehensive metabolic panel  3. Screening for iron deficiency anemia Await labs and f/u - CBC with Differential/Platelet  4. Hyperlipidemia, unspecified hyperlipidemia type Await labs and f/u, add meds.  5. Sleep apnea with use of continuous positive airway pressure (CPAP) [facility] about tubes for CPAP machine.   6. Essential hypertension Continue lisinopril, recheck at next visit. - CBC with Differential/Platelet - Comprehensive metabolic panel - TSH  7. Hepatic steatosis Await labs and f/u  8. Pilonidal fistula No management necessary at this time.  9. Situational stress Try Wellbutrin. Will help with smoking cessation as well as stress management.  buPROPion (WELLBUTRIN XL) 150 MG 24 hr tablet; Take 1 tablet (150 mg total) by mouth daily.  Dispense: 90 tablet; Refill: 3  10. BMI 50.0-59.9, adult (HCC) Try weight management. Try wellbutrin. - Amb Ref to Medical Weight Management  11. Smoker Try chantix again for cessation.  12. Tobacco abuse counseling Try chantix. Informed to taper dosing back up to 1mg  BID. Start at 0.5 mg QDAY. - varenicline (CHANTIX) 1 MG tablet; Take 1 tablet (1 mg total) by mouth 2 (two) times daily.  Dispense: 60 tablet; Refill: 5   Return in about 1 month (around  08/10/2017) for re-evalaution of blood pressure, mood, smoking.

## 2017-07-14 LAB — CBC WITH DIFFERENTIAL/PLATELET
BASOS: 0 %
Basophils Absolute: 0 10*3/uL (ref 0.0–0.2)
EOS (ABSOLUTE): 0.2 10*3/uL (ref 0.0–0.4)
EOS: 2 %
HEMATOCRIT: 46.5 % (ref 37.5–51.0)
HEMOGLOBIN: 15.4 g/dL (ref 13.0–17.7)
IMMATURE GRANS (ABS): 0.1 10*3/uL (ref 0.0–0.1)
Immature Granulocytes: 1 %
LYMPHS: 26 %
Lymphocytes Absolute: 2.8 10*3/uL (ref 0.7–3.1)
MCH: 30.6 pg (ref 26.6–33.0)
MCHC: 33.1 g/dL (ref 31.5–35.7)
MCV: 92 fL (ref 79–97)
MONOCYTES: 6 %
MONOS ABS: 0.6 10*3/uL (ref 0.1–0.9)
NEUTROS PCT: 65 %
Neutrophils Absolute: 7.2 10*3/uL — ABNORMAL HIGH (ref 1.4–7.0)
Platelets: 329 10*3/uL (ref 150–379)
RBC: 5.04 x10E6/uL (ref 4.14–5.80)
RDW: 14.2 % (ref 12.3–15.4)
WBC: 10.8 10*3/uL (ref 3.4–10.8)

## 2017-07-14 LAB — LIPID PANEL
CHOL/HDL RATIO: 4.3 ratio (ref 0.0–5.0)
Cholesterol, Total: 217 mg/dL — ABNORMAL HIGH (ref 100–199)
HDL: 51 mg/dL (ref >39)
LDL CALC: 147 mg/dL — AB (ref 0–99)
Triglycerides: 95 mg/dL (ref 0–149)
VLDL CHOLESTEROL CAL: 19 mg/dL (ref 5–40)

## 2017-07-14 LAB — URINALYSIS, DIPSTICK ONLY
Bilirubin, UA: NEGATIVE
Glucose, UA: NEGATIVE
KETONES UA: NEGATIVE
LEUKOCYTES UA: NEGATIVE
Nitrite, UA: NEGATIVE
RBC UA: NEGATIVE
SPEC GRAV UA: 1.024 (ref 1.005–1.030)
Urobilinogen, Ur: 0.2 mg/dL (ref 0.2–1.0)
pH, UA: 6 (ref 5.0–7.5)

## 2017-07-14 LAB — COMPREHENSIVE METABOLIC PANEL
A/G RATIO: 1.4 (ref 1.2–2.2)
ALT: 42 IU/L (ref 0–44)
AST: 32 IU/L (ref 0–40)
Albumin: 4.2 g/dL (ref 3.5–5.5)
Alkaline Phosphatase: 89 IU/L (ref 39–117)
BILIRUBIN TOTAL: 0.4 mg/dL (ref 0.0–1.2)
BUN/Creatinine Ratio: 13 (ref 9–20)
BUN: 12 mg/dL (ref 6–20)
CHLORIDE: 99 mmol/L (ref 96–106)
CO2: 26 mmol/L (ref 20–29)
Calcium: 9.6 mg/dL (ref 8.7–10.2)
Creatinine, Ser: 0.91 mg/dL (ref 0.76–1.27)
GFR calc non Af Amer: 107 mL/min/{1.73_m2} (ref >59)
GFR, EST AFRICAN AMERICAN: 123 mL/min/{1.73_m2} (ref >59)
GLOBULIN, TOTAL: 2.9 g/dL (ref 1.5–4.5)
Glucose: 111 mg/dL — ABNORMAL HIGH (ref 65–99)
POTASSIUM: 4.9 mmol/L (ref 3.5–5.2)
SODIUM: 138 mmol/L (ref 134–144)
Total Protein: 7.1 g/dL (ref 6.0–8.5)

## 2017-07-14 LAB — TSH: TSH: 1.61 u[IU]/mL (ref 0.450–4.500)

## 2017-07-15 DIAGNOSIS — R739 Hyperglycemia, unspecified: Secondary | ICD-10-CM | POA: Diagnosis not present

## 2017-07-15 LAB — HEMOGLOBIN A1C
Est. average glucose Bld gHb Est-mCnc: 146 mg/dL
HEMOGLOBIN A1C: 6.7 % — AB (ref 4.8–5.6)

## 2017-07-15 NOTE — Addendum Note (Signed)
Addended by: Baldwin CrownJOHNSON, SHAQUETTA D on: 07/15/2017 10:09 AM   Modules accepted: Orders

## 2017-07-19 DIAGNOSIS — E119 Type 2 diabetes mellitus without complications: Secondary | ICD-10-CM | POA: Insufficient documentation

## 2017-07-19 MED ORDER — METFORMIN HCL ER 500 MG PO TB24: 500.0000 mg | ORAL_TABLET | Freq: Every day | ORAL | 3 refills | Status: AC

## 2017-07-19 MED ORDER — ATORVASTATIN CALCIUM 20 MG PO TABS: 20.0000 mg | ORAL_TABLET | Freq: Every day | ORAL | 3 refills | Status: AC

## 2017-07-19 NOTE — Addendum Note (Signed)
Addended by: Fernande BrasJEFFERY, Wanita Derenzo S on: 07/19/2017 02:47 PM   Modules accepted: Orders

## 2017-08-12 ENCOUNTER — Encounter: Payer: Self-pay | Admitting: Physician Assistant

## 2017-08-12 ENCOUNTER — Ambulatory Visit: Payer: 59 | Admitting: Physician Assistant

## 2017-08-12 ENCOUNTER — Other Ambulatory Visit: Payer: Self-pay

## 2017-08-12 VITALS — BP 128/78 | HR 99 | Temp 98.5°F | Resp 16 | Ht 69.0 in | Wt 373.8 lb

## 2017-08-12 DIAGNOSIS — E119 Type 2 diabetes mellitus without complications: Secondary | ICD-10-CM | POA: Diagnosis not present

## 2017-08-12 DIAGNOSIS — I10 Essential (primary) hypertension: Secondary | ICD-10-CM | POA: Diagnosis not present

## 2017-08-12 DIAGNOSIS — E785 Hyperlipidemia, unspecified: Secondary | ICD-10-CM | POA: Diagnosis not present

## 2017-08-12 DIAGNOSIS — Z6841 Body Mass Index (BMI) 40.0 and over, adult: Secondary | ICD-10-CM

## 2017-08-12 DIAGNOSIS — F17200 Nicotine dependence, unspecified, uncomplicated: Secondary | ICD-10-CM

## 2017-08-12 DIAGNOSIS — F439 Reaction to severe stress, unspecified: Secondary | ICD-10-CM

## 2017-08-12 NOTE — Patient Instructions (Addendum)
Keep up the GREAT work!!!!  Go ahead and call [facility] to schedule your next visit with me there. [phone].   For therapy -- [facility] ([address] [place]) [place] [phone] [facility] [address] [place]) - [phone] [facility] - [phone] [facility] - [phone] [place] [place] - [phone] Jeanella Flattery - [facility], [phone] [facility]  - [phone] (do not file insurance) Paula Compton [phone] [facility] - [phone] Jeanella Flattery - [facility], [phone] [facility] - [phone]    IF you received an x-ray today, you will receive an invoice from [facility]. Please contact [facility] at [phone] with questions or concerns regarding your invoice.   IF you received labwork today, you will receive an invoice from [facility]. Please contact [facility] at [phone] with questions or concerns regarding your invoice.   Our billing staff will not be able to assist you with questions regarding bills from these companies.  You will be contacted with the lab results as soon as they are available. The fastest way to get your results is to activate your My Chart account. Instructions are located on the last page of this paperwork. If you have not heard from [place] regarding the results in 2 weeks, please contact this office.

## 2017-08-12 NOTE — Progress Notes (Signed)
Patient ID: Bruce Macias, male    DOB: [birth year], 37 y.o.   MRN: [id number]  PCP: [name], PA-C  Chief Complaint  Patient presents with  . Hypertension    mood and smoking follow up     Subjective:   Presents for evaluation of hypertension and new onset diabetes.  He requested Adderall and Ativan at his Wellness Visit in April. I declined, but prescribed bupropion. Mood is improved. Hasn't tested the benefit with regard to attention, but is getting ready to start back to school in the fall. Has noticed a big change in his appetite, reduction in snacking/emotional eating. Has also reduced smoking.  Was going to the gym, but hasn't had any change in his weight.  Has noticed that his close fit better. Tolerating metformin and atorvastatin without difficulty. Is scheduling with [facility], but due to his work schedule that is very difficult.   Review of Systems  Constitutional: Negative for activity change, appetite change, fatigue and unexpected weight change.  HENT: Negative for congestion, dental problem, ear pain, hearing loss, mouth sores, postnasal drip, rhinorrhea, sneezing, sore throat, tinnitus and trouble swallowing.   Eyes: Negative for photophobia, pain, redness and visual disturbance.  Respiratory: Negative for cough, chest tightness and shortness of breath.   Cardiovascular: Negative for chest pain, palpitations and leg swelling.  Gastrointestinal: Negative for abdominal pain, blood in stool, constipation, diarrhea, nausea and vomiting.  Endocrine: Negative for cold intolerance, heat intolerance, polydipsia, polyphagia and polyuria.  Genitourinary: Negative for dysuria, frequency, hematuria and urgency.  Musculoskeletal: Negative for arthralgias, gait problem, myalgias and neck stiffness.  Skin: Negative for rash.  Neurological: Negative for dizziness, speech difficulty, weakness, light-headedness, numbness and headaches.  Hematological: Negative for  adenopathy.  Psychiatric/Behavioral: Negative for confusion and sleep disturbance. The patient is not nervous/anxious.      Depression screen [facility] 2/9 08/12/2017 07/13/2017 07/13/2017 04/09/2016 02/05/2015  Decreased Interest 0 0 0 0 0  Down, Depressed, Hopeless 0 0 0 0 0  PHQ - 2 Score 0 0 0 0 0     Patient Active Problem List   Diagnosis Date Noted  . Type 2 diabetes mellitus without complication, without long-term current use of insulin (HCC) 07/19/2017  . Diverticulitis of colon 07/13/2017  . Smoker 07/13/2017  . Situational stress 07/13/2017  . 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 Hepatic steatosis 02/05/2015  . Hypertension 05/14/2011  . BMI 50.0-59.9, adult (HCC) 05/14/2011  . Hyperlipidemia 05/14/2011  . Sleep apnea with use of continuous positive airway pressure (CPAP) 05/14/2011     Prior to Admission medications   Medication Sig Start Date End Date Taking? Authorizing Provider  atorvastatin (LIPITOR) 20 MG tablet Take 1 tablet (20 mg total) by mouth daily. 07/19/17  Yes Bethannie Iglehart, PA-C  buPROPion (WELLBUTRIN XL) 150 MG 24 hr tablet Take 1 tablet (150 mg total) by mouth daily. 07/13/17  Yes Evia Goldsmith, PA-C  lisinopril (PRINIVIL,ZESTRIL) 10 MG tablet Take 1 tablet (10 mg total) by mouth daily. 07/13/17  Yes Bodhi Moradi, PA-C  metFORMIN (GLUCOPHAGE-XR) 500 MG 24 hr tablet Take 1 tablet (500 mg total) by mouth daily with breakfast. 07/19/17  Yes Deovion Batrez, PA-C  Multiple Vitamin (MULTIVITAMIN WITH MINERALS) TABS tablet Take 1 tablet by mouth daily.   Yes [provider]  varenicline (CHANTIX) 1 MG tablet Take 1 tablet (1 mg total) by mouth 2 (two) times daily. 07/13/17  Yes Vardaan Depascale, PA-C     No Known Allergies  Objective:  Physical Exam  Constitutional: He is oriented to person, place, and time. He appears well-developed and well-nourished. He is active and cooperative. No distress.  BP 128/78   Pulse 99   Temp 98.5 F (36.9 C)   Resp  16   Ht  (1.753 m)   Wt (!) 373 lb 12.8 oz (169.6 kg)   SpO2 96%   BMI 55.20 kg/m   HENT:  Head: Normocephalic and atraumatic.  Right Ear: Hearing normal.  Left Ear: Hearing normal.  Eyes: Conjunctivae are normal. No scleral icterus.  Neck: Normal range of motion. Neck supple. No thyromegaly present.  Cardiovascular: Normal rate, regular rhythm and normal heart sounds.  Pulses:      Radial pulses are 2+ on the right side, and 2+ on the left side.  Pulmonary/Chest: Effort normal and breath sounds normal.  Lymphadenopathy:       Head (right side): No tonsillar, no preauricular, no posterior auricular and no occipital adenopathy present.       Head (left side): No tonsillar, no preauricular, no posterior auricular and no occipital adenopathy present.    He has no cervical adenopathy.       Right: No supraclavicular adenopathy present.       Left: No supraclavicular adenopathy present.  Neurological: He is alert and oriented to person, place, and time. No sensory deficit.  Skin: Skin is warm, dry and intact. No rash noted. No cyanosis or erythema. Nails show no clubbing.  Psychiatric: He has a normal mood and affect. His speech is normal and behavior is normal.    Wt Readings from Last 3 Encounters:  08/12/17 (!) 373 lb 12.8 oz (169.6 kg)  07/13/17 (!) 372 lb 6.4 oz (168.9 kg)  04/09/16 (!) 399 lb 6.4 oz (181.2 kg)    BP Readings from Last 3 Encounters:  08/12/17 128/78  07/13/17 (!) 122/98  04/09/16 (!) 190/130       Assessment & Plan:   Problem List Items Addressed This Visit    Hypertension    Well-controlled.  Continue lisinopril 10 mg daily.  Continue healthy lifestyle changes, efforts towards smoking cessation.      BMI 50.0-59.9, adult [facility])    Encouraged him to make time for medical weight management.      Hyperlipidemia    Tolerating atorvastatin.  Continue.  Goal LDL is less than 70.      Smoker    Cutting back.  Encouraged his continued efforts.   Bupropion, for mood, seems to be helping.      Situational stress    Considerable improvement with the addition of bupropion.  Continue.      Type 2 diabetes mellitus without complication, without long-term current use of insulin (HCC) - Primary    Tolerating metformin.  Continue efforts for healthy eating and exercise.  Pursue medical weight management.  Encouraged continued efforts towards smoking cessation.          Return in about 3 months (around 11/12/2017) for re-evaluation of diabetes, cholesterol, blood pressure, mood, weight, smoking and attention.Fernande Bras, PA-C [facility]

## 2017-08-13 NOTE — Assessment & Plan Note (Signed)
Tolerating atorvastatin.  Continue.  Goal LDL is less than 70.

## 2017-08-13 NOTE — Assessment & Plan Note (Signed)
Well-controlled.  Continue lisinopril 10 mg daily.  Continue healthy lifestyle changes, efforts towards smoking cessation.

## 2017-08-13 NOTE — Assessment & Plan Note (Signed)
Tolerating metformin.  Continue efforts for healthy eating and exercise.  Pursue medical weight management.  Encouraged continued efforts towards smoking cessation.

## 2017-08-13 NOTE — Assessment & Plan Note (Signed)
Considerable improvement with the addition of bupropion.  Continue.

## 2017-08-13 NOTE — Assessment & Plan Note (Signed)
Encouraged him to make time for medical weight management.

## 2017-08-13 NOTE — Assessment & Plan Note (Signed)
Cutting back.  Encouraged his continued efforts.  Bupropion, for mood, seems to be helping.

## 2017-08-25 ENCOUNTER — Encounter (INDEPENDENT_AMBULATORY_CARE_PROVIDER_SITE_OTHER): Payer: 59

## 2017-08-26 DIAGNOSIS — M25561 Pain in right knee: Secondary | ICD-10-CM | POA: Diagnosis not present

## 2017-08-26 DIAGNOSIS — M25461 Effusion, right knee: Secondary | ICD-10-CM | POA: Diagnosis not present

## 2017-09-07 ENCOUNTER — Ambulatory Visit (INDEPENDENT_AMBULATORY_CARE_PROVIDER_SITE_OTHER): Payer: 59 | Admitting: Family Medicine

## 2018-03-03 DIAGNOSIS — H1820 Unspecified corneal edema: Secondary | ICD-10-CM | POA: Diagnosis not present

## 2018-03-08 DIAGNOSIS — H1820 Unspecified corneal edema: Secondary | ICD-10-CM | POA: Diagnosis not present

## 2018-04-24 ENCOUNTER — Other Ambulatory Visit: Payer: Self-pay | Admitting: Podiatry

## 2018-04-24 ENCOUNTER — Encounter: Payer: Self-pay | Admitting: Podiatry

## 2018-04-24 ENCOUNTER — Ambulatory Visit: Payer: 59 | Admitting: Podiatry

## 2018-04-24 ENCOUNTER — Ambulatory Visit (INDEPENDENT_AMBULATORY_CARE_PROVIDER_SITE_OTHER): Payer: 59

## 2018-04-24 DIAGNOSIS — M21619 Bunion of unspecified foot: Secondary | ICD-10-CM | POA: Diagnosis not present

## 2018-04-24 DIAGNOSIS — M21612 Bunion of left foot: Secondary | ICD-10-CM | POA: Diagnosis not present

## 2018-04-24 DIAGNOSIS — M79672 Pain in left foot: Secondary | ICD-10-CM

## 2018-04-24 DIAGNOSIS — L600 Ingrowing nail: Secondary | ICD-10-CM

## 2018-04-24 DIAGNOSIS — M21611 Bunion of right foot: Secondary | ICD-10-CM

## 2018-04-24 DIAGNOSIS — M79671 Pain in right foot: Secondary | ICD-10-CM

## 2018-04-26 NOTE — Progress Notes (Signed)
Subjective:   Patient ID: Bruce Macias, male   DOB: 38 y.o.   MRN: [id number]   HPI Patient presents with severely thickened left second nail that is dystrophic and painful with palpation and also concerned about bunion deformity of both feet.  Patient states the nail gets sore and he is not able to cut it and patient does have good digital perfusion well oriented x3   Review of Systems  All other systems reviewed and are negative.       Objective:  Physical Exam Vitals signs and nursing note reviewed.  Constitutional:      Appearance: He is well-developed.  Pulmonary:     Effort: Pulmonary effort is normal.  Musculoskeletal: Normal range of motion.  Skin:    General: Skin is warm.  Neurological:     Mental Status: He is alert.     Neurovascular status intact muscle strength is adequate range of motion within normal limits with patient found to have a severely thickened dystrophic hallux nail left but does get tender and is found to have structural bunion deformity bilateral with redness and moderate discomfort.  Patient has mild flatfoot deformity is obese which is complicating factor and has good digital perfusion well oriented x3    Assessment:  Severe nail disease with thickness left hallux with pain along with structural bunion deformity bilateral     Plan:  H&P both conditions discussed and I do think this nail will be need to be removed and I educated him on this.  Patient wants to have it done but cannot do today due to work and will be scheduled for the procedure and for the bunions I recommended wider shoe gear and soft leather material  X-ray indicates that there is moderate structural bunion deformity bilateral with flatfoot deformity noted bilateral

## 2018-04-28 ENCOUNTER — Ambulatory Visit: Payer: 59 | Admitting: Podiatry

## 2018-04-28 ENCOUNTER — Encounter: Payer: Self-pay | Admitting: Podiatry

## 2018-04-28 DIAGNOSIS — L600 Ingrowing nail: Secondary | ICD-10-CM | POA: Diagnosis not present

## 2018-04-28 MED ORDER — NEOMYCIN-POLYMYXIN-HC 1 % OT SOLN: OTIC | 1 refills | Status: DC

## 2018-04-28 NOTE — Progress Notes (Signed)
Subjective:   Patient ID: Bruce Macias, male   DOB: 38 y.o.   MRN: [id number]   HPI Patient presents with a severely thickened left hallux nail that is been bothering him a long time and is here for removal of nail understanding risk   ROS      Objective:  Physical Exam  Neurovascular status intact with thick yellow brittle nailbeds left big toe that sore when pressed     Assessment:  Chronic nail disease with structural changes of the nailbed with pain left     Plan:  H&P condition reviewed and recommended nail removal.  I explained procedure risk patient wants surgery and today I infiltrated the left hallux 60 mg like Marcaine mixture I did sterile prep of the toe and then remove the hallux nail with sterile instrumentation exposed matrix and applied phenol  Occasions 30 seconds followed by alcohol lavage and sterile dressing.  Gave instructions on soaks reappoint encouraging him to leave dressing on 24 hours but to take it off earlier if necessary.  Patient will be seen back to recheck and is encouraged to call with any questions he may have

## 2018-04-28 NOTE — Patient Instructions (Signed)

## 2018-05-11 ENCOUNTER — Emergency Department (HOSPITAL_COMMUNITY): Payer: 59

## 2018-05-11 ENCOUNTER — Encounter (HOSPITAL_COMMUNITY): Payer: Self-pay | Admitting: Emergency Medicine

## 2018-05-11 ENCOUNTER — Emergency Department (HOSPITAL_COMMUNITY)
Admission: EM | Admit: 2018-05-11 | Discharge: 2018-05-11 | Disposition: A | Discharge status: Home / Self Care | Payer: 59 | Attending: Emergency Medicine | Admitting: Emergency Medicine

## 2018-05-11 ENCOUNTER — Other Ambulatory Visit: Payer: Self-pay

## 2018-05-11 DIAGNOSIS — F17210 Nicotine dependence, cigarettes, uncomplicated: Secondary | ICD-10-CM | POA: Diagnosis not present

## 2018-05-11 DIAGNOSIS — I10 Essential (primary) hypertension: Secondary | ICD-10-CM | POA: Insufficient documentation

## 2018-05-11 DIAGNOSIS — M546 Pain in thoracic spine: Secondary | ICD-10-CM | POA: Diagnosis not present

## 2018-05-11 DIAGNOSIS — Z7984 Long term (current) use of oral hypoglycemic drugs: Secondary | ICD-10-CM | POA: Diagnosis not present

## 2018-05-11 DIAGNOSIS — Z79899 Other long term (current) drug therapy: Secondary | ICD-10-CM | POA: Diagnosis not present

## 2018-05-11 DIAGNOSIS — E119 Type 2 diabetes mellitus without complications: Secondary | ICD-10-CM | POA: Insufficient documentation

## 2018-05-11 DIAGNOSIS — M545 Low back pain, unspecified: Secondary | ICD-10-CM

## 2018-05-11 DIAGNOSIS — S299XXA Unspecified injury of thorax, initial encounter: Secondary | ICD-10-CM | POA: Diagnosis not present

## 2018-05-11 MED ORDER — METHOCARBAMOL 500 MG PO TABS: 500.0000 mg | ORAL_TABLET | Freq: Once | ORAL | Status: DC

## 2018-05-11 MED ORDER — ACETAMINOPHEN 500 MG PO TABS
1000.0000 mg | ORAL_TABLET | Freq: Once | ORAL | Status: AC
  Administered 2018-05-11: 1000 mg via ORAL
  Filled 2018-05-11: qty 2

## 2018-05-11 MED ORDER — METHOCARBAMOL 500 MG PO TABS: 500.0000 mg | ORAL_TABLET | Freq: Two times a day (BID) | ORAL | 0 refills | Status: DC

## 2018-05-11 NOTE — Discharge Instructions (Signed)
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
## 2018-05-11 NOTE — ED Notes (Signed)
Patient verbalizes understanding of discharge instructions. Opportunity for questioning and answers were provided. Armband removed by staff, pt discharged from ED.  

## 2018-11-08 ENCOUNTER — Emergency Department (HOSPITAL_BASED_OUTPATIENT_CLINIC_OR_DEPARTMENT_OTHER)
Admission: EM | Admit: 2018-11-08 | Discharge: 2018-11-08 | Disposition: A | Discharge status: Home / Self Care | Payer: 59 | Attending: Emergency Medicine | Admitting: Emergency Medicine

## 2018-11-08 ENCOUNTER — Encounter (HOSPITAL_BASED_OUTPATIENT_CLINIC_OR_DEPARTMENT_OTHER): Payer: Self-pay

## 2018-11-08 ENCOUNTER — Other Ambulatory Visit: Payer: Self-pay

## 2018-11-08 ENCOUNTER — Emergency Department (HOSPITAL_BASED_OUTPATIENT_CLINIC_OR_DEPARTMENT_OTHER): Payer: 59

## 2018-11-08 DIAGNOSIS — I10 Essential (primary) hypertension: Secondary | ICD-10-CM | POA: Diagnosis not present

## 2018-11-08 DIAGNOSIS — R1032 Left lower quadrant pain: Secondary | ICD-10-CM | POA: Diagnosis present

## 2018-11-08 DIAGNOSIS — Z79899 Other long term (current) drug therapy: Secondary | ICD-10-CM | POA: Diagnosis not present

## 2018-11-08 DIAGNOSIS — F17210 Nicotine dependence, cigarettes, uncomplicated: Secondary | ICD-10-CM | POA: Insufficient documentation

## 2018-11-08 DIAGNOSIS — F1210 Cannabis abuse, uncomplicated: Secondary | ICD-10-CM | POA: Diagnosis not present

## 2018-11-08 DIAGNOSIS — E119 Type 2 diabetes mellitus without complications: Secondary | ICD-10-CM | POA: Insufficient documentation

## 2018-11-08 DIAGNOSIS — K5792 Diverticulitis of intestine, part unspecified, without perforation or abscess without bleeding: Secondary | ICD-10-CM | POA: Diagnosis not present

## 2018-11-08 DIAGNOSIS — Z7984 Long term (current) use of oral hypoglycemic drugs: Secondary | ICD-10-CM | POA: Insufficient documentation

## 2018-11-08 HISTORY — DX: Diverticulitis of intestine, part unspecified, without perforation or abscess without bleeding: K57.92

## 2018-11-08 LAB — CBC WITH DIFFERENTIAL/PLATELET
Abs Immature Granulocytes: 0.02 K/uL (ref 0.00–0.07)
Basophils Absolute: 0 K/uL (ref 0.0–0.1)
Basophils Relative: 0 %
Eosinophils Absolute: 0.2 K/uL (ref 0.0–0.5)
Eosinophils Relative: 2 %
HCT: 42.4 % (ref 39.0–52.0)
Hemoglobin: 14.1 g/dL (ref 13.0–17.0)
Immature Granulocytes: 0 %
Lymphocytes Relative: 26 %
Lymphs Abs: 2.8 K/uL (ref 0.7–4.0)
MCH: 30.9 pg (ref 26.0–34.0)
MCHC: 33.3 g/dL (ref 30.0–36.0)
MCV: 92.8 fL (ref 80.0–100.0)
Monocytes Absolute: 0.9 K/uL (ref 0.1–1.0)
Monocytes Relative: 8 %
Neutro Abs: 7 K/uL (ref 1.7–7.7)
Neutrophils Relative %: 64 %
Platelets: 271 K/uL (ref 150–400)
RBC: 4.57 MIL/uL (ref 4.22–5.81)
RDW: 12.4 % (ref 11.5–15.5)
WBC: 10.9 K/uL — ABNORMAL HIGH (ref 4.0–10.5)
nRBC: 0 % (ref 0.0–0.2)

## 2018-11-08 LAB — COMPREHENSIVE METABOLIC PANEL WITH GFR
ALT: 50 U/L — ABNORMAL HIGH (ref 0–44)
AST: 35 U/L (ref 15–41)
Albumin: 3.8 g/dL (ref 3.5–5.0)
Alkaline Phosphatase: 71 U/L (ref 38–126)
Anion gap: 9 (ref 5–15)
BUN: 9 mg/dL (ref 6–20)
CO2: 28 mmol/L (ref 22–32)
Calcium: 9 mg/dL (ref 8.9–10.3)
Chloride: 99 mmol/L (ref 98–111)
Creatinine, Ser: 0.66 mg/dL (ref 0.61–1.24)
GFR calc Af Amer: >60 mL/min
GFR calc non Af Amer: >60 mL/min
Glucose, Bld: 131 mg/dL — ABNORMAL HIGH (ref 70–99)
Potassium: 3.6 mmol/L (ref 3.5–5.1)
Sodium: 136 mmol/L (ref 135–145)
Total Bilirubin: 0.7 mg/dL (ref 0.3–1.2)
Total Protein: 6.9 g/dL (ref 6.5–8.1)

## 2018-11-08 LAB — URINALYSIS, ROUTINE W REFLEX MICROSCOPIC
Bilirubin Urine: NEGATIVE
Glucose, UA: NEGATIVE mg/dL
Ketones, ur: NEGATIVE mg/dL
Leukocytes,Ua: NEGATIVE
Nitrite: NEGATIVE
Protein, ur: 100 mg/dL — AB
Specific Gravity, Urine: >1.03 — ABNORMAL HIGH (ref 1.005–1.030)
pH: 6.5 (ref 5.0–8.0)

## 2018-11-08 LAB — LIPASE, BLOOD: Lipase: 26 U/L (ref 11–51)

## 2018-11-08 LAB — URINALYSIS, MICROSCOPIC (REFLEX)

## 2018-11-08 MED ORDER — METRONIDAZOLE 500 MG PO TABS
500.0000 mg | ORAL_TABLET | Freq: Once | ORAL | Status: AC
  Administered 2018-11-08: 500 mg via ORAL
  Filled 2018-11-08: qty 1

## 2018-11-08 MED ORDER — CIPROFLOXACIN HCL 500 MG PO TABS
500.0000 mg | ORAL_TABLET | Freq: Once | ORAL | Status: AC
  Administered 2018-11-08: 500 mg via ORAL
  Filled 2018-11-08: qty 1

## 2018-11-08 MED ORDER — CIPROFLOXACIN HCL 500 MG PO TABS: 500.0000 mg | ORAL_TABLET | Freq: Two times a day (BID) | ORAL | 0 refills | Status: AC

## 2018-11-08 MED ORDER — METRONIDAZOLE 500 MG PO TABS: 500.0000 mg | ORAL_TABLET | Freq: Three times a day (TID) | ORAL | 0 refills | Status: AC

## 2018-11-08 MED ORDER — IOHEXOL 300 MG/ML  SOLN
100.0000 mL | Freq: Once | INTRAMUSCULAR | Status: AC
  Administered 2018-11-08: 100 mL via INTRAVENOUS

## 2018-11-08 NOTE — ED Triage Notes (Signed)
Pt c/oabd pain x today-states he has hx of diverticulitis and was advised to not eat popcorn-pt ate popcorn 2 days prior to abd pain-denies v/d-NAD-steady gait

## 2018-11-08 NOTE — ED Provider Notes (Signed)
[facility] EMERGENCY DEPARTMENT Provider Note   CSN: [id number] Arrival date & time: 11/08/18  1637    History   Chief Complaint Chief Complaint  Patient presents with  . Abdominal Pain    HPI Bruce Macias is a 38 y.o. male with PMhx diverticulitis, HTN, diabetes who presents to the ED today complaining of gradual onset, constant, LLQ abdominal pain that began this AM. Pt reports hx of diverticulitis and states this pain feels similar. He states he ate popcorn 2 days ago and was told he wasn't supposed to have that. He is concerned it caused him to have this pain. He has not taken anything for it. Denies fever, chills, nausea, vomiting, diarrhea, constipation, or any other associated symptoms. No suspicious food intake. No recent abx use. Denies EtOH.        Past Medical History:  Diagnosis Date  . Boil 06/2010   LEFT inguinal  . Condyloma acuminatum of perianal region 06/2009   treated with imiquimod and then dermatology evaluation  . Diverticulitis   . Erectile dysfunction   . Hypertension   . Psoriasis   . Vitamin D deficiency 2011   took vit D 50,000 IU weekly for brief period    Patient Active Problem List   Diagnosis Date Noted  . Type 2 diabetes mellitus without complication, without long-term current use of insulin ([place]) 07/19/2017  . Diverticulitis of colon 07/13/2017  . Smoker 07/13/2017  . Situational stress 07/13/2017  . 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 Hepatic steatosis 02/05/2015  . Hypertension 05/14/2011  . BMI 50.0-59.9, adult ([place]) 05/14/2011  . Hyperlipidemia 05/14/2011  . Sleep apnea with use of continuous positive airway pressure (CPAP) 05/14/2011    History reviewed. No pertinent surgical history.      Home Medications    Prior to Admission medications   Medication Sig Start Date End Date Taking? Authorizing Provider  atorvastatin (LIPITOR) 20 MG tablet Take 1 tablet (20 mg total) by mouth daily. 07/19/17   Harrison Mons, PA  buPROPion (WELLBUTRIN XL) 150 MG 24 hr tablet Take 1 tablet (150 mg total) by mouth daily. 07/13/17   Harrison Mons, PA  ciprofloxacin (CIPRO) 500 MG tablet Take 1 tablet (500 mg total) by mouth 2 (two) times daily for 7 days. 11/08/18 11/15/18  Eustaquio Maize, PA-C  ibuprofen (ADVIL,MOTRIN) 800 MG tablet  03/15/18   [provider]  lisinopril (PRINIVIL,ZESTRIL) 10 MG tablet Take 1 tablet (10 mg total) by mouth daily. 07/13/17   Harrison Mons, PA  metFORMIN (GLUCOPHAGE-XR) 500 MG 24 hr tablet Take 1 tablet (500 mg total) by mouth daily with breakfast. 07/19/17   Harrison Mons, PA  methocarbamol (ROBAXIN) 500 MG tablet Take 1 tablet (500 mg total) by mouth 2 (two) times daily. 05/11/18   Jacqlyn Larsen, PA-C  metroNIDAZOLE (FLAGYL) 500 MG tablet Take 1 tablet (500 mg total) by mouth 3 (three) times daily for 7 days. 11/08/18 11/15/18  Eustaquio Maize, PA-C  Multiple Vitamin (MULTIVITAMIN WITH MINERALS) TABS tablet Take 1 tablet by mouth daily.    [provider]  NEOMYCIN-POLYMYXIN-HYDROCORTISONE (CORTISPORIN) 1 % SOLN OTIC solution Apply 1-2 drops to toe BID after soaking 04/28/18   Wallene Huh, DPM  varenicline (CHANTIX) 1 MG tablet Take 1 tablet (1 mg total) by mouth 2 (two) times daily. 07/13/17   Harrison Mons, PA    Family History Family History  Problem Relation Age of Onset  . Cancer Father     Social History Social  History   Tobacco Use  . Smoking status: Current Some Day Smoker    Packs/day: 0.33    Years: 10.00    Pack years: 3.30    Types: Cigarettes  . Smokeless tobacco: Never Used  Substance Use Topics  . Alcohol use: No    Alcohol/week: 0.0 standard drinks  . Drug use: Yes    Types: Marijuana     Allergies   Patient has no known allergies.   Review of Systems Review of Systems  Constitutional: Negative for chills and fever.  HENT: Negative for congestion.   Eyes: Negative for visual disturbance.  Respiratory: Negative for  shortness of breath.   Cardiovascular: Negative for chest pain.  Gastrointestinal: Positive for abdominal pain. Negative for constipation, diarrhea, nausea and vomiting.  Genitourinary: Negative for difficulty urinating.  Musculoskeletal: Negative for myalgias.  Skin: Negative for rash.  Neurological: Negative for headaches.     Physical Exam Updated Vital Signs BP (!) 155/98 (BP Location: Left Arm)   Pulse (!) 103   Temp 98.4 F (36.9 C) (Oral)   Resp 20   Ht 5\' 9"  (1.753 m)   Wt (!) 180.5 kg   SpO2 98%   BMI 58.77 kg/m   Physical Exam Vitals signs and nursing note reviewed.  Constitutional:      Appearance: He is not ill-appearing.  HENT:     Head: Normocephalic and atraumatic.  Eyes:     Conjunctiva/sclera: Conjunctivae normal.  Neck:     Musculoskeletal: Neck supple.  Cardiovascular:     Rate and Rhythm: Normal rate and regular rhythm.  Pulmonary:     Effort: Pulmonary effort is normal.     Breath sounds: Normal breath sounds.  Abdominal:     Palpations: Abdomen is soft.     Tenderness: There is abdominal tenderness in the left lower quadrant. There is no guarding or rebound.  Skin:    General: Skin is warm and dry.  Neurological:     Mental Status: He is alert.      ED Treatments / Results  Labs (all labs ordered are listed, but only abnormal results are displayed) Labs Reviewed  COMPREHENSIVE METABOLIC PANEL - Abnormal; Notable for the following components:      Result Value   Glucose, Bld 131 (*)    ALT 50 (*)    All other components within normal limits  CBC WITH DIFFERENTIAL/PLATELET - Abnormal; Notable for the following components:   WBC 10.9 (*)    All other components within normal limits  URINALYSIS, ROUTINE W REFLEX MICROSCOPIC - Abnormal; Notable for the following components:   Specific Gravity, Urine >1.030 (*)    Hgb urine dipstick TRACE (*)    Protein, ur 100 (*)    All other components within normal limits  URINALYSIS, MICROSCOPIC  (REFLEX) - Abnormal; Notable for the following components:   Bacteria, UA RARE (*)    All other components within normal limits  LIPASE, BLOOD    EKG None  Radiology Ct Abdomen Pelvis W Contrast  Result Date: 11/08/2018 CLINICAL DATA:  Abdominal pain, diverticulitis suspected EXAM: CT ABDOMEN AND PELVIS WITH CONTRAST TECHNIQUE: Multidetector CT imaging of the abdomen and pelvis was performed using the standard protocol following bolus administration of intravenous contrast. CONTRAST:  100mL OMNIPAQUE IOHEXOL 300 MG/ML  SOLN COMPARISON:  None. FINDINGS: Lower chest: Lung bases are clear. Normal heart size. No pericardial effusion. 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 Hepatobiliary: No focal liver abnormality is seen. No gallstones, gallbladder  wall thickening, or biliary dilatation. Pancreas: Unremarkable. No pancreatic ductal dilatation or surrounding inflammatory changes. Spleen: Normal in size without focal abnormality. Adrenals/Urinary Tract: Adrenal glands are unremarkable. Kidneys are normal, without renal calculi, focal lesion, or hydronephrosis. Bladder is unremarkable. Stomach/Bowel: Distal esophagus, stomach and duodenal sweep are unremarkable. No small bowel wall thickening or dilatation. No evidence of obstruction. There are scattered colonic diverticula. Focal pericolonic inflammation appears centered upon a culprit diverticulum in the distal descending colon (axial 57). Adjacent phlegmonous stranding and trace reactive free fluid tracking in the left paracolic gutter without organized collection, abscess or extraluminal gas. Vascular/Lymphatic: Atherosclerotic plaque within the normal caliber aorta. Reproductive: The prostate and seminal vesicles are unremarkable. Other: No abdominopelvic free fluid or free gas. No bowel containing hernias. Fat containing umbilical hernia Musculoskeletal: No acute or significant osseous findings. Mild facet arthropathy at L5-S1.  IMPRESSION: Acute uncomplicated diverticulitis of the distal descending colon. No evidence of perforation or abscess formation. Aortic Atherosclerosis (ICD10-I70.0). Electronically Signed   By: Kreg ShropshirePrice  DeHay M.D.   On: 11/08/2018 19:22    Procedures Procedures (including critical care time)  Medications Ordered in ED Medications  iohexol (OMNIPAQUE) 300 MG/ML solution 100 mL (100 mLs Intravenous Contrast Given 11/08/18 1857)     Initial Impression / Assessment and Plan / ED Course  I have reviewed the triage vital signs and the nursing notes.  Pertinent labs & imaging results that were available during my care of the patient were reviewed by me and considered in my medical decision making (see chart for details).    38 year old male presenting to the ED tonight with complaints of LLQ abdominal pain that started today. Hx of diverticulitis. Reports he ate popcorn 2 days ago and thinks it may have irritated his bowels. No nausea, vomiting, diarrhea. Pt appears comfortable in the exam room. He is afebrile without tachycardia or tachypnea. Will obtain baseline bloodwork and CT A/P at this time.   Mild leukocytosis of 10.9,could be related to infection vs pain response. Hgb stable. No electrolyte abnormalities. Lipase negative. Bacteria does have some hgb present and bacteria with 6-10 epithelial cells. Pt has had hgb prior; no urinary sx currently.   CT scan positive for acute uncomplicated diverticulitis. Will discharge home with Rx cipro and flagyl at this time. Discussed need to refrain from EtOH with flagyl. Pt to follow up with PCP. Strict return precautions discussed. Pt is in agreement with plan at this time and stable for discharge home.        Final Clinical Impressions(s) / ED Diagnoses   Final diagnoses:  Diverticulitis    ED Discharge Orders         Ordered    metroNIDAZOLE (FLAGYL) 500 MG tablet  3 times daily     11/08/18 1941    ciprofloxacin (CIPRO) 500 MG tablet  2  times daily     11/08/18 1941           Tanda RockersVenter, Jasher Barkan, PA-C 11/08/18 2304    Vanetta MuldersZackowski, Scott, MD 11/11/18 [phone]

## 2018-11-08 NOTE — Discharge Instructions (Signed)
You were seen in the ED today for abdominal pain Your CT scan did show acute diverticulitis Please take antibiotics as prescribed for the next week. It is very important to refrain from any alcohol use while on these medications as you can have a very severe reaction.  Please follow up with your PCP when you see them at the end of the month.  If you begin to have worsening symptoms including worsening pain, vomiting, fever > 100.4 after taking antibiotics please return to the ED immediately

## 2019-03-03 ENCOUNTER — Emergency Department (HOSPITAL_BASED_OUTPATIENT_CLINIC_OR_DEPARTMENT_OTHER)
Admission: EM | Admit: 2019-03-03 | Discharge: 2019-03-04 | Disposition: A | Discharge status: Home / Self Care | Payer: 59 | Attending: Emergency Medicine | Admitting: Emergency Medicine

## 2019-03-03 ENCOUNTER — Encounter (HOSPITAL_BASED_OUTPATIENT_CLINIC_OR_DEPARTMENT_OTHER): Payer: Self-pay | Admitting: Emergency Medicine

## 2019-03-03 ENCOUNTER — Other Ambulatory Visit: Payer: Self-pay

## 2019-03-03 DIAGNOSIS — Z79899 Other long term (current) drug therapy: Secondary | ICD-10-CM | POA: Diagnosis not present

## 2019-03-03 DIAGNOSIS — N10 Acute tubulo-interstitial nephritis: Secondary | ICD-10-CM

## 2019-03-03 DIAGNOSIS — E119 Type 2 diabetes mellitus without complications: Secondary | ICD-10-CM | POA: Insufficient documentation

## 2019-03-03 DIAGNOSIS — F17210 Nicotine dependence, cigarettes, uncomplicated: Secondary | ICD-10-CM | POA: Insufficient documentation

## 2019-03-03 DIAGNOSIS — R1011 Right upper quadrant pain: Secondary | ICD-10-CM | POA: Insufficient documentation

## 2019-03-03 DIAGNOSIS — I10 Essential (primary) hypertension: Secondary | ICD-10-CM | POA: Diagnosis not present

## 2019-03-03 DIAGNOSIS — R1032 Left lower quadrant pain: Secondary | ICD-10-CM | POA: Diagnosis present

## 2019-03-03 DIAGNOSIS — R1012 Left upper quadrant pain: Secondary | ICD-10-CM

## 2019-03-03 LAB — URINALYSIS, ROUTINE W REFLEX MICROSCOPIC
Bilirubin Urine: NEGATIVE
Glucose, UA: NEGATIVE mg/dL
Ketones, ur: NEGATIVE mg/dL
Nitrite: POSITIVE — AB
Protein, ur: 100 mg/dL — AB
Specific Gravity, Urine: 1.025 (ref 1.005–1.030)
pH: 6 (ref 5.0–8.0)

## 2019-03-03 LAB — CBC WITH DIFFERENTIAL/PLATELET
Abs Immature Granulocytes: 0.04 10*3/uL (ref 0.00–0.07)
Basophils Absolute: 0.1 10*3/uL (ref 0.0–0.1)
Basophils Relative: 0 %
Eosinophils Absolute: 0.2 10*3/uL (ref 0.0–0.5)
Eosinophils Relative: 1 %
HCT: 45.5 % (ref 39.0–52.0)
Hemoglobin: 15.1 g/dL (ref 13.0–17.0)
Immature Granulocytes: 0 %
Lymphocytes Relative: 17 %
Lymphs Abs: 2.3 10*3/uL (ref 0.7–4.0)
MCH: 30.7 pg (ref 26.0–34.0)
MCHC: 33.2 g/dL (ref 30.0–36.0)
MCV: 92.5 fL (ref 80.0–100.0)
Monocytes Absolute: 0.9 10*3/uL (ref 0.1–1.0)
Monocytes Relative: 7 %
Neutro Abs: 10.3 10*3/uL — ABNORMAL HIGH (ref 1.7–7.7)
Neutrophils Relative %: 75 %
Platelets: 351 10*3/uL (ref 150–400)
RBC: 4.92 MIL/uL (ref 4.22–5.81)
RDW: 12.9 % (ref 11.5–15.5)
WBC: 13.7 10*3/uL — ABNORMAL HIGH (ref 4.0–10.5)
nRBC: 0 % (ref 0.0–0.2)

## 2019-03-03 LAB — COMPREHENSIVE METABOLIC PANEL
ALT: 51 U/L — ABNORMAL HIGH (ref 0–44)
AST: 36 U/L (ref 15–41)
Albumin: 4 g/dL (ref 3.5–5.0)
Alkaline Phosphatase: 86 U/L (ref 38–126)
Anion gap: 10 (ref 5–15)
BUN: 13 mg/dL (ref 6–20)
CO2: 25 mmol/L (ref 22–32)
Calcium: 9.7 mg/dL (ref 8.9–10.3)
Chloride: 101 mmol/L (ref 98–111)
Creatinine, Ser: 0.93 mg/dL (ref 0.61–1.24)
GFR calc Af Amer: >60 mL/min (ref >60)
GFR calc non Af Amer: >60 mL/min (ref >60)
Glucose, Bld: 196 mg/dL — ABNORMAL HIGH (ref 70–99)
Potassium: 4.3 mmol/L (ref 3.5–5.1)
Sodium: 136 mmol/L (ref 135–145)
Total Bilirubin: 0.8 mg/dL (ref 0.3–1.2)
Total Protein: 7.6 g/dL (ref 6.5–8.1)

## 2019-03-03 LAB — URINALYSIS, MICROSCOPIC (REFLEX)

## 2019-03-03 LAB — LIPASE, BLOOD: Lipase: 24 U/L (ref 11–51)

## 2019-03-03 MED ORDER — METRONIDAZOLE 500 MG PO TABS: 500.0000 mg | ORAL_TABLET | Freq: Three times a day (TID) | ORAL | 0 refills | Status: AC

## 2019-03-03 MED ORDER — METRONIDAZOLE 500 MG PO TABS
500.0000 mg | ORAL_TABLET | Freq: Once | ORAL | Status: AC
  Administered 2019-03-03: 500 mg via ORAL
  Filled 2019-03-03: qty 1

## 2019-03-03 MED ORDER — CIPROFLOXACIN HCL 500 MG PO TABS
500.0000 mg | ORAL_TABLET | Freq: Once | ORAL | Status: AC
  Administered 2019-03-03: 500 mg via ORAL
  Filled 2019-03-03: qty 1

## 2019-03-03 MED ORDER — CIPROFLOXACIN HCL 500 MG PO TABS: 500.0000 mg | ORAL_TABLET | Freq: Two times a day (BID) | ORAL | 0 refills | Status: AC

## 2019-03-03 NOTE — ED Notes (Signed)
ED Provider at bedside. Dr. Rees 

## 2019-03-03 NOTE — ED Provider Notes (Signed)
[facility] EMERGENCY DEPARTMENT Provider Note   CSN: [id number] Arrival date & time: 03/03/19  2213     History   Chief Complaint Chief Complaint  Patient presents with  . Flank Pain    HPI Bruce Macias is a 38 y.o. male.     Patient with a  has a past medical history of Boil (06/2010), Condyloma acuminatum of perianal region (06/2009), Diverticulitis, Erectile dysfunction, Hypertension, Psoriasis, and Vitamin D deficiency (2011) presents with 2 days of abdominal pain.  Patient recently had a UTI 2 weeks ago.  Was treated with Macrobid.  His complete antibiotics for this.  Patient states that the abdominal pain is worsening.  Associated with nausea.  Denies fevers or chills.  Endorses some blood in stool 3 or 4 days ago.  Denies constipation diarrhea, vomiting, fevers, chills, dysuria, hematuria, chest pain, shortness of breath.  Patient states that he took amoxicillin yesterday for his diverticulitis without improvement.  Patient was last seen in the ED 10/2018.  Had a CT scan that showed acute diverticulitis without perforation.  He was treated with ciprofloxacin and Flagyl course.  Denies any heartburn type symptoms, patient says he occasionally takes NSAIDs for tooth pain but does not take any for several days..      Past Medical History:  Diagnosis Date  . Boil 06/2010   LEFT inguinal  . Condyloma acuminatum of perianal region 06/2009   treated with imiquimod and then dermatology evaluation  . Diverticulitis   . Erectile dysfunction   . Hypertension   . Psoriasis   . Vitamin D deficiency 2011   took vit D 50,000 IU weekly for brief period    Patient Active Problem List   Diagnosis Date Noted  . Type 2 diabetes mellitus without complication, without long-term current use of insulin ([facility]) 07/19/2017  . Diverticulitis of colon 07/13/2017  . Smoker 07/13/2017  . Situational stress 07/13/2017  . 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 Hepatic steatosis 02/05/2015   . Hypertension 05/14/2011  . BMI 50.0-59.9, adult ([facility]) 05/14/2011  . Hyperlipidemia 05/14/2011  . Sleep apnea with use of continuous positive airway pressure (CPAP) 05/14/2011    History reviewed. No pertinent surgical history.      Home Medications    Prior to Admission medications   Medication Sig Start Date End Date Taking? Authorizing Provider  atorvastatin (LIPITOR) 20 MG tablet Take 1 tablet (20 mg total) by mouth daily. 07/19/17   Porfirio OarJeffery, Chelle, PA  buPROPion (WELLBUTRIN XL) 150 MG 24 hr tablet Take 1 tablet (150 mg total) by mouth daily. 07/13/17   Porfirio OarJeffery, Chelle, PA  ciprofloxacin (CIPRO) 500 MG tablet Take 1 tablet (500 mg total) by mouth 2 (two) times daily for 10 days. 03/03/19 03/13/19  Garnette Gunnerhompson, Demetrie Borge B, MD  ibuprofen (ADVIL,MOTRIN) 800 MG tablet  03/15/18   [provider]  lisinopril (PRINIVIL,ZESTRIL) 10 MG tablet Take 1 tablet (10 mg total) by mouth daily. 07/13/17   Porfirio OarJeffery, Chelle, PA  metFORMIN (GLUCOPHAGE-XR) 500 MG 24 hr tablet Take 1 tablet (500 mg total) by mouth daily with breakfast. 07/19/17   Porfirio OarJeffery, Chelle, PA  methocarbamol (ROBAXIN) 500 MG tablet Take 1 tablet (500 mg total) by mouth 2 (two) times daily. 05/11/18   Dartha LodgeFord, Kelsey N, PA-C  metroNIDAZOLE (FLAGYL) 500 MG tablet Take 1 tablet (500 mg total) by mouth 3 (three) times daily for 7 days. 03/03/19 03/10/19  Garnette Gunnerhompson, Daphane Odekirk B, MD  Multiple Vitamin (MULTIVITAMIN WITH MINERALS) TABS tablet Take 1 tablet by mouth  daily.    [provider]  NEOMYCIN-POLYMYXIN-HYDROCORTISONE (CORTISPORIN) 1 % SOLN OTIC solution Apply 1-2 drops to toe BID after soaking 04/28/18   Wallene Huh, DPM  varenicline (CHANTIX) 1 MG tablet Take 1 tablet (1 mg total) by mouth 2 (two) times daily. 07/13/17   Harrison Mons, PA    Family History Family History  Problem Relation Age of Onset  . Cancer Father     Social History Social History   Tobacco Use  . Smoking status: Current Some Day Smoker     Packs/day: 0.33    Years: 10.00    Pack years: 3.30    Types: Cigarettes  . Smokeless tobacco: Never Used  Substance Use Topics  . Alcohol use: No    Alcohol/week: 0.0 standard drinks  . Drug use: Yes    Types: Marijuana     Allergies   Patient has no known allergies.   Review of Systems Review of Systems As per HPI  Physical Exam Updated Vital Signs BP (!) 172/101 (BP Location: Right Arm)   Pulse 97   Temp 98.6 F (37 C) (Oral)   Resp 18   Ht 5\' 9"  (1.753 m)   Wt (!) 176.9 kg   SpO2 100%   BMI 57.59 kg/m   Physical Exam Constitutional:      General: He is not in acute distress.    Appearance: Normal appearance.  HENT:     Head: Normocephalic and atraumatic.     Nose: Nose normal.     Mouth/Throat:     Mouth: Mucous membranes are moist.     Pharynx: Oropharynx is clear.  Neck:     Musculoskeletal: Neck supple.  Cardiovascular:     Rate and Rhythm: Normal rate and regular rhythm.  Pulmonary:     Effort: Pulmonary effort is normal.     Breath sounds: Normal breath sounds.  Abdominal:     General: Abdomen is flat.     Tenderness: There is no abdominal tenderness. There is no right CVA tenderness or left CVA tenderness.  Musculoskeletal: Normal range of motion.        General: No tenderness.  Skin:    General: Skin is warm and dry.  Neurological:     General: No focal deficit present.     Mental Status: He is alert and oriented to person, place, and time.  Psychiatric:        Mood and Affect: Mood normal.        Behavior: Behavior normal.      ED Treatments / Results  Labs (all labs ordered are listed, but only abnormal results are displayed) Labs Reviewed  COMPREHENSIVE METABOLIC PANEL - Abnormal; Notable for the following components:      Result Value   Glucose, Bld 196 (*)    ALT 51 (*)    All other components within normal limits  CBC WITH DIFFERENTIAL/PLATELET - Abnormal; Notable for the following components:   WBC 13.7 (*)    Neutro Abs  10.3 (*)    All other components within normal limits  URINALYSIS, ROUTINE W REFLEX MICROSCOPIC - Abnormal; Notable for the following components:   APPearance CLOUDY (*)    Hgb urine dipstick LARGE (*)    Protein, ur 100 (*)    Nitrite POSITIVE (*)    Leukocytes,Ua TRACE (*)    All other components within normal limits  URINALYSIS, MICROSCOPIC (REFLEX) - Abnormal; Notable for the following components:   Bacteria, UA MANY (*)  All other components within normal limits  URINE CULTURE  LIPASE, BLOOD    EKG None  Radiology No results found.  Procedures Procedures (including critical care time)  Medications Ordered in ED Medications  ciprofloxacin (CIPRO) tablet 500 mg (has no administration in time range)  metroNIDAZOLE (FLAGYL) tablet 500 mg (has no administration in time range)     Initial Impression / Assessment and Plan / ED Course  I have reviewed the triage vital signs and the nursing notes.  Pertinent labs & imaging results that were available during my care of the patient were reviewed by me and considered in my medical decision making (see chart for details).        Patient presenting with 2 days of left upper quadrant abdominal pain.  Given history of diverticulitis, most likely has a mild flare of this.  Unusual that patient had recent UTI and was treated with Macrobid.  Will get UA to evaluate for UTI, evaluate for hematuria from possible stone.  Given recent abdominal scan in August and relatively reassuring appearance, do not feel that patient will need to get rescanned with CT abdomen pelvis.  Unlikely peptic ulcer disease or GERD.  Will get CBC, CMP, lipase, UA.  Patient with HTN SBP 170. Likely close to baseline with elevation due to pain. Patient to follow up with PCP for further mgmt.   Surprisingly, patient labs consistent with mild pyelonephritis. UA with many bacteria, nitrites, leukocyte esterase, many bacteria.  Patient also with mild leukocytosis of  13. Will get UCx.   Will treat for complicated UTI. Will also cover for acute diverticulitis. Ciprofloxicin for 10 days. Flagyl for 7 days. Recommend patient follow up with PCP to discuss possible longer course of antibiotics.   Final Clinical Impressions(s) / ED Diagnoses   Final diagnoses:  Acute pyelonephritis  Left upper quadrant abdominal pain    ED Discharge Orders         Ordered    ciprofloxacin (CIPRO) 500 MG tablet  2 times daily     03/03/19 2331    metroNIDAZOLE (FLAGYL) 500 MG tablet  3 times daily     03/03/19 2331           Garnette Gunner, MD 03/03/19 9357    Tilden Fossa, MD 03/04/19 1747

## 2019-03-03 NOTE — ED Notes (Signed)
ED Provider at bedside. Resident MD at bedside

## 2019-03-03 NOTE — Discharge Instructions (Addendum)
Your presentation is likely due to a urinary tract infection. Although we are treating you for acute diverticulitis as well. Please follow up with your regular doctor regarding your recurrent urinary tract infection. You may need to be on a longer course of antibiotics.

## 2019-03-03 NOTE — ED Triage Notes (Signed)
Patient states that he has had abdominal pain to his left flank all day - he reports that he is having nausea

## 2019-03-06 ENCOUNTER — Encounter: Payer: Self-pay | Admitting: Gastroenterology

## 2019-03-06 LAB — URINE CULTURE: Culture: >=100000 — AB

## 2019-03-07 ENCOUNTER — Telehealth: Payer: Self-pay | Admitting: *Deleted

## 2019-03-07 NOTE — Telephone Encounter (Signed)
Post ED Visit - Positive Culture Follow-up  Culture report reviewed by antimicrobial stewardship pharmacist: [facility] Team []  Elenor Quinones, Pharm.D. []  Heide Guile, Pharm.D., BCPS AQ-ID []  Parks Neptune, Pharm.D., BCPS []  Alycia Rossetti, Pharm.D., BCPS []  [place], Pharm.D., BCPS, AAHIVP []  Legrand Como, Pharm.D., BCPS, AAHIVP []  Salome Arnt, PharmD, BCPS []  Johnnette Gourd, PharmD, BCPS []  Hughes Better, PharmD, BCPS []  Leeroy Cha, PharmD []  Laqueta Linden, PharmD, BCPS []  Albertina Parr, PharmD  [place] Team []  Leodis Sias, PharmD []  Lindell Spar, PharmD []  Royetta Asal, PharmD []  Graylin Shiver, Rph []  Rema Fendt) Glennon Mac, PharmD []  Arlyn Dunning, PharmD []  Netta Cedars, PharmD []  Dia Sitter, PharmD []  Leone Haven, PharmD []  Gretta Arab, PharmD []  Theodis Shove, PharmD []  Peggyann Juba, PharmD []  Reuel Boom, PharmD Elicia Lamp, PharmD  Positive urine culture Treated with Ciprofloxacin HCL and Metronidazole, organism sensitive to the same and no further patient follow-up is required at this time.  Harlon Flor [facility] 03/07/2019, 3:59 PM

## 2019-04-10 ENCOUNTER — Encounter: Payer: Self-pay | Admitting: Gastroenterology

## 2019-04-10 ENCOUNTER — Ambulatory Visit: Payer: 59 | Admitting: Gastroenterology

## 2019-04-10 VITALS — BP 120/88 | HR 108 | Temp 98.9°F | Ht 69.0 in | Wt 385.0 lb

## 2019-04-10 DIAGNOSIS — K625 Hemorrhage of anus and rectum: Secondary | ICD-10-CM

## 2019-04-10 DIAGNOSIS — K5732 Diverticulitis of large intestine without perforation or abscess without bleeding: Secondary | ICD-10-CM | POA: Diagnosis not present

## 2019-04-10 NOTE — Progress Notes (Signed)
[facility] Consult Note:  History: Bruce Macias 04/10/2019  Referring provider: Maryagnes Amos, FNP  Reason for consult/chief complaint: Diverticulitis (pt feeling better on Cipro and Flagyl; abdominal pain has resolved)   Subjective  HPI: Acute left-sided diverticulitis diagnosed on CT scan at ED visits in October 2017 and August 2020.  Seen in emergency department 03/03/2019 with a history of recent UTI treated with Macrobid.  He was having abdominal pain not improving with outpatient empiric treatment with amoxicillin. Urinalysis consistent with UTI, clinical impression was UTI and possible diverticulitis.  No additional scans done that visit.  Treated with ciprofloxacin for 10 days and Flagyl for 7 days. Urine culture positive for E. Coli  Travers describes the symptoms during the most recent ED visit as a midline lower abdominal and pelvic pressure along with dysuria and hematuria..  Previous episodes of diverticulitis the pain was more toward the left side and was also characterized as a pressure.  On all the occasions he had some acute constipation, and with the most recent episode had difficulty moving his bowels for a few days before the ED visit, had to strain, and then had some small-volume rectal bleeding.  Previous episodes of constipation had caused some blood on the paper that he attributed to local irritation.   He denies chronic abdominal pain, his stool is usually formed and soft, which is the case these days after finishing his antibiotics and resolution of symptoms.  Khoa denies chronic upper digestive symptoms such as dysphagia nausea vomiting early satiety.  ROS:  Review of Systems  Constitutional: Negative for appetite change and unexpected weight change.  HENT: Negative for mouth sores and voice change.   Eyes: Negative for pain and redness.  Respiratory: Negative for cough and shortness of breath.   Cardiovascular:  Negative for chest pain and palpitations.  Genitourinary: Negative for dysuria and hematuria.  Musculoskeletal: Negative for arthralgias and myalgias.  Skin: Negative for pallor and rash.  Neurological: Negative for weakness and headaches.  Hematological: Negative for adenopathy.   Depression stable  Past Medical History: Past Medical History:  Diagnosis Date  . Boil 06/2010   LEFT inguinal  . Condyloma acuminatum of perianal region 06/2009   treated with imiquimod and then dermatology evaluation  . Diverticulitis   . Erectile dysfunction   . Hypertension   . Psoriasis   . Vitamin D deficiency 2011   took vit D 50,000 IU weekly for brief period     Past Surgical History: History reviewed. No pertinent surgical history.   Family History: Family History  Problem Relation Age of Onset  . Cancer Father     Social History: Social History   Socioeconomic History  . Marital status: Married    Spouse name: [name]  . Number of children: 1  . Years of education: Not on file  . Highest education level: Not on file  Occupational History  . Occupation: CASE WORKER    Employer: [employer]  . Occupation: retail    Comment: [employer]  Tobacco Use  . Smoking status: Current Some Day Smoker    Packs/day: 0.33    Years: 10.00    Pack years: 3.30    Types: Cigarettes  . Smokeless tobacco: Never Used  Substance and Sexual Activity  . Alcohol use: No    Alcohol/week: 0.0 standard drinks  . Drug use: Yes    Types: Marijuana  . Sexual activity: Not on file  Other Topics Concern  .  Not on file  Social History Narrative   Exercise cardio and free weights 3-4 days/week.   Lives with his wife and daughter.   Attending online-degree program (bachelor's) through [employer].   Social Determinants of Health   Financial Resource Strain:   . Difficulty of Paying Living Expenses: Not on file  Food Insecurity:   . Worried About Programme researcher, broadcasting/film/video in the Last Year: Not on file  .  Ran Out of Food in the Last Year: Not on file  Transportation Needs:   . Lack of Transportation (Medical): Not on file  . Lack of Transportation (Non-Medical): Not on file  Physical Activity:   . Days of Exercise per Week: Not on file  . Minutes of Exercise per Session: Not on file  Stress:   . Feeling of Stress : Not on file  Social Connections:   . Frequency of Communication with Friends and Family: Not on file  . Frequency of Social Gatherings with Friends and Family: Not on file  . Attends Religious Services: Not on file  . Active Member of Clubs or Organizations: Not on file  . Attends Banker Meetings: Not on file  . Marital Status: Not on file   Works at [employer] and also at a group home in [place].  Allergies: No Known Allergies  Outpatient Meds: Current Outpatient Medications  Medication Sig Dispense Refill  . atorvastatin (LIPITOR) 20 MG tablet Take 1 tablet (20 mg total) by mouth daily. 90 tablet 3  . buPROPion (WELLBUTRIN XL) 150 MG 24 hr tablet Take 1 tablet (150 mg total) by mouth daily. 90 tablet 3  . ibuprofen (ADVIL,MOTRIN) 800 MG tablet     . lisinopril (PRINIVIL,ZESTRIL) 10 MG tablet Take 1 tablet (10 mg total) by mouth daily. 90 tablet 3  . metFORMIN (GLUCOPHAGE-XR) 500 MG 24 hr tablet Take 1 tablet (500 mg total) by mouth daily with breakfast. 90 tablet 3  . Multiple Vitamin (MULTIVITAMIN WITH MINERALS) TABS tablet Take 1 tablet by mouth daily.    . RYBELSUS 3 MG TABS Take 1 tablet by mouth daily.     No current facility-administered medications for this visit.      ___________________________________________________________________ Objective   Exam:  BP 120/88   Pulse (!) 108   Temp 98.9 F (37.2 C)   Ht 5\' 9"  (1.753 m)   Wt (!) 385 lb (174.6 kg)   BMI 56.85 kg/m    General: Morbidly obese, well-appearing  Eyes: sclera anicteric, no redness  ENT: oral mucosa moist without lesions, no cervical or supraclavicular  lymphadenopathy  CV: RRR without murmur, S1/S2, no JVD, no peripheral edema  Resp: clear to auscultation bilaterally, normal RR and effort noted  GI: soft, no tenderness, with active bowel sounds.  Cannot assess mass or hepatosplenomegaly due to abdominal girth.  Skin; warm and dry, no rash or jaundice noted  Neuro: awake, alert and oriented x 3. Normal gross motor function and fluent speech Rectal: Perianal condyloma.  Soft light brown stool, no fissure or tenderness, no palpable internal lesion, but can only examine most distal rectum due to body habitus.  Labs:  CBC Latest Ref Rng & Units 03/03/2019 11/08/2018 07/13/2017  WBC 4.0 - 10.5 K/uL 13.7(H) 10.9(H) 10.8  Hemoglobin 13.0 - 17.0 g/dL 07/15/2017 55.9 74.1  Hematocrit 39.0 - 52.0 % 45.5 42.4 46.5  Platelets 150 - 400 K/uL 351 271 329   CMP Latest Ref Rng & Units 03/03/2019 11/08/2018 07/13/2017  Glucose 70 - 99  mg/dL 196(H) 131(H) 111(H)  BUN 6 - 20 mg/dL 13 9 12   Creatinine 0.61 - 1.24 mg/dL 0.93 0.66 0.91  Sodium 135 - 145 mmol/L 136 136 138  Potassium 3.5 - 5.1 mmol/L 4.3 3.6 4.9  Chloride 98 - 111 mmol/L 101 99 99  CO2 22 - 32 mmol/L 25 28 26   Calcium 8.9 - 10.3 mg/dL 9.7 9.0 9.6  Total Protein 6.5 - 8.1 g/dL 7.6 6.9 7.1  Total Bilirubin 0.3 - 1.2 mg/dL 0.8 0.7 0.4  Alkaline Phos 38 - 126 U/L 86 71 89  AST 15 - 41 U/L 36 35 32  ALT 0 - 44 U/L 51(H) 50(H) 42     Radiologic Studies:  CLINICAL DATA:  Abdominal pain, diverticulitis suspected   EXAM: CT ABDOMEN AND PELVIS WITH CONTRAST   TECHNIQUE: Multidetector CT imaging of the abdomen and pelvis was performed using the standard protocol following bolus administration of intravenous contrast.   CONTRAST:  147mL OMNIPAQUE IOHEXOL 300 MG/ML  SOLN   COMPARISON:  None.   FINDINGS: Lower chest: Lung bases are clear. Normal heart size. No pericardial effusion. 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   Hepatobiliary: No focal liver abnormality is seen.  No gallstones, gallbladder wall thickening, or biliary dilatation.   Pancreas: Unremarkable. No pancreatic ductal dilatation or surrounding inflammatory changes.   Spleen: Normal in size without focal abnormality.   Adrenals/Urinary Tract: Adrenal glands are unremarkable. Kidneys are normal, without renal calculi, focal lesion, or hydronephrosis. Bladder is unremarkable.   Stomach/Bowel: Distal esophagus, stomach and duodenal sweep are unremarkable. No small bowel wall thickening or dilatation. No evidence of obstruction. There are scattered colonic diverticula. Focal pericolonic inflammation appears centered upon a culprit diverticulum in the distal descending colon (axial 57). Adjacent phlegmonous stranding and trace reactive free fluid tracking in the left paracolic gutter without organized collection, abscess or extraluminal gas.   Vascular/Lymphatic: Atherosclerotic plaque within the normal caliber aorta.   Reproductive: The prostate and seminal vesicles are unremarkable.   Other: No abdominopelvic free fluid or free gas. No bowel containing hernias. Fat containing umbilical hernia   Musculoskeletal: No acute or significant osseous findings. Mild facet arthropathy at L5-S1.   IMPRESSION: Acute uncomplicated diverticulitis of the distal descending colon. No evidence of perforation or abscess formation.   Aortic Atherosclerosis (ICD10-I70.0).     Electronically Signed   By: Lovena Le M.D.   On: 11/08/2018 19:22     Assessment: Encounter Diagnoses  Name Primary?  . Diverticulitis of large intestine without perforation or abscess without bleeding Yes  . Rectal bleeding     Recurrent diverticulitis, though it is not clear if he truly had diverticulitis on the most recent ED visit.  He definitely had an E. coli UTI.  He does not recall being informed that his urine culture was positive for bacteria.   The rectal bleeding as he describes it is most likely benign  anal rectal bleeding related to acute constipation.  Because of that and recurrent diverticulitis, he should have a colonoscopy.  However, at this time we cannot proceed with that.  His colonoscopy would have to be done in the hospital outpatient lab due to his elevated BMI, but there are now Covid related restrictions on anything other than urgent endoscopic procedures, and we expect those restrictions to probably last a few months.  We have therefore put [place] on our call list.  Plan:  He was wondering if there were specific medicines or other things he should  do to try and prevent the diverticulitis, and mention that he had done some research and was avoiding possible trigger foods. There is no chronic medical therapy that may prevent these episodes.  If he has recurrent symptoms, he should let me know, as a CT scan may be warranted.  If he has recurrence of similar symptoms to the December ED visit, he might also require evaluation with primary care to see if he has recurrent UTI.  Thank you for the courtesy of this consult.  Please call me with any questions or concerns.  Charlie Pitter III  CC: Referring provider noted above

## 2019-04-10 NOTE — Patient Instructions (Signed)
You will be due for a recall colonoscopy. We will send you a reminder in the mail when it gets closer to that time.  If you are age 41 or older, your body mass index should be between 23-30. Your Body mass index is 56.85 kg/m. If this is out of the aforementioned range listed, please consider follow up with your Primary Care Provider.  If you are age 68 or younger, your body mass index should be between 19-25. Your Body mass index is 56.85 kg/m. If this is out of the aformentioned range listed, please consider follow up with your Primary Care Provider.    It was a pleasure to see you today!  Dr. Myrtie Neither

## 2019-05-02 ENCOUNTER — Other Ambulatory Visit: Payer: Self-pay | Admitting: *Deleted

## 2019-05-02 ENCOUNTER — Telehealth: Payer: Self-pay | Admitting: *Deleted

## 2019-05-02 ENCOUNTER — Encounter: Payer: Self-pay | Admitting: *Deleted

## 2019-05-02 DIAGNOSIS — K5732 Diverticulitis of large intestine without perforation or abscess without bleeding: Secondary | ICD-10-CM

## 2019-05-02 DIAGNOSIS — K625 Hemorrhage of anus and rectum: Secondary | ICD-10-CM

## 2019-05-02 NOTE — Telephone Encounter (Addendum)
Spoke to the patient and scheduled the following appts with the patient:   05/10/19 at 1:30 pm PV with nurse  05/17/19 at 10:15 am COVID screening  05/21/19 at 10:15 am Colon at [facility] (Case ID: [id number])  Reminder letter sent to patient.

## 2019-05-10 ENCOUNTER — Other Ambulatory Visit: Payer: Self-pay

## 2019-05-10 ENCOUNTER — Ambulatory Visit: Payer: 59 | Admitting: *Deleted

## 2019-05-10 ENCOUNTER — Encounter: Payer: Self-pay | Admitting: Gastroenterology

## 2019-05-10 VITALS — Temp 98.0°F | Ht 69.0 in | Wt 393.0 lb

## 2019-05-10 DIAGNOSIS — K5732 Diverticulitis of large intestine without perforation or abscess without bleeding: Secondary | ICD-10-CM

## 2019-05-10 MED ORDER — SUPREP BOWEL PREP KIT 17.5-3.13-1.6 GM/177ML PO SOLN: ORAL | 0 refills | Status: AC

## 2019-05-10 NOTE — Progress Notes (Signed)
Pt's covid test is sent up for 05-17-19 at 10:15 at the [facility]  No egg or soy allergy  No home oxygen use or problems with anesthesia  No medications for weight loss taken  emmi information given  Pt given instructions regarding hospital procedure

## 2019-05-17 ENCOUNTER — Other Ambulatory Visit (HOSPITAL_COMMUNITY): Payer: 59

## 2019-05-18 ENCOUNTER — Other Ambulatory Visit (HOSPITAL_COMMUNITY)
Admission: RE | Admit: 2019-05-18 | Discharge: 2019-05-18 | Disposition: A | Discharge status: Home / Self Care | Payer: 59 | Source: Ambulatory Visit | Attending: Gastroenterology | Admitting: Gastroenterology

## 2019-05-18 DIAGNOSIS — Z01812 Encounter for preprocedural laboratory examination: Secondary | ICD-10-CM | POA: Diagnosis present

## 2019-05-18 DIAGNOSIS — Z20822 Contact with and (suspected) exposure to covid-19: Secondary | ICD-10-CM | POA: Diagnosis not present

## 2019-05-18 LAB — SARS CORONAVIRUS 2 (TAT 6-24 HRS): SARS Coronavirus 2: NEGATIVE

## 2019-05-21 ENCOUNTER — Other Ambulatory Visit: Payer: Self-pay

## 2019-05-21 ENCOUNTER — Encounter (HOSPITAL_COMMUNITY)
Admission: RE | Disposition: A | Discharge status: Home / Self Care | Payer: Self-pay | Source: Ambulatory Visit | Attending: Gastroenterology

## 2019-05-21 ENCOUNTER — Encounter (HOSPITAL_COMMUNITY): Payer: Self-pay | Admitting: Gastroenterology

## 2019-05-21 ENCOUNTER — Ambulatory Visit (HOSPITAL_COMMUNITY)
Admission: RE | Admit: 2019-05-21 | Discharge: 2019-05-21 | Disposition: A | Discharge status: Home / Self Care | Payer: 59 | Source: Ambulatory Visit | Attending: Gastroenterology | Admitting: Gastroenterology

## 2019-05-21 ENCOUNTER — Ambulatory Visit (HOSPITAL_COMMUNITY): Payer: 59 | Admitting: Anesthesiology

## 2019-05-21 DIAGNOSIS — K5730 Diverticulosis of large intestine without perforation or abscess without bleeding: Secondary | ICD-10-CM | POA: Diagnosis not present

## 2019-05-21 DIAGNOSIS — G4730 Sleep apnea, unspecified: Secondary | ICD-10-CM | POA: Diagnosis not present

## 2019-05-21 DIAGNOSIS — K625 Hemorrhage of anus and rectum: Secondary | ICD-10-CM | POA: Insufficient documentation

## 2019-05-21 DIAGNOSIS — I10 Essential (primary) hypertension: Secondary | ICD-10-CM | POA: Insufficient documentation

## 2019-05-21 DIAGNOSIS — K5732 Diverticulitis of large intestine without perforation or abscess without bleeding: Secondary | ICD-10-CM

## 2019-05-21 DIAGNOSIS — F17200 Nicotine dependence, unspecified, uncomplicated: Secondary | ICD-10-CM | POA: Diagnosis not present

## 2019-05-21 DIAGNOSIS — E119 Type 2 diabetes mellitus without complications: Secondary | ICD-10-CM | POA: Diagnosis not present

## 2019-05-21 HISTORY — PX: COLONOSCOPY WITH PROPOFOL: SHX5780

## 2019-05-21 LAB — POCT I-STAT, CHEM 8
BUN: 9 mg/dL (ref 6–20)
BUN: 9 mg/dL (ref 6–20)
Calcium, Ion: 1.08 mmol/L — ABNORMAL LOW (ref 1.15–1.40)
Calcium, Ion: 1.1 mmol/L — ABNORMAL LOW (ref 1.15–1.40)
Chloride: 102 mmol/L (ref 98–111)
Chloride: 103 mmol/L (ref 98–111)
Creatinine, Ser: 0.7 mg/dL (ref 0.61–1.24)
Creatinine, Ser: 0.7 mg/dL (ref 0.61–1.24)
Glucose, Bld: 126 mg/dL — ABNORMAL HIGH (ref 70–99)
Glucose, Bld: 127 mg/dL — ABNORMAL HIGH (ref 70–99)
HCT: 45 % (ref 39.0–52.0)
HCT: 45 % (ref 39.0–52.0)
Hemoglobin: 15.3 g/dL (ref 13.0–17.0)
Hemoglobin: 15.3 g/dL (ref 13.0–17.0)
Potassium: 6.9 mmol/L (ref 3.5–5.1)
Potassium: 7 mmol/L (ref 3.5–5.1)
Sodium: 137 mmol/L (ref 135–145)
Sodium: 137 mmol/L (ref 135–145)
TCO2: 32 mmol/L (ref 22–32)
TCO2: 32 mmol/L (ref 22–32)

## 2019-05-21 LAB — COMPREHENSIVE METABOLIC PANEL
ALT: 55 U/L — ABNORMAL HIGH (ref 0–44)
AST: 43 U/L — ABNORMAL HIGH (ref 15–41)
Albumin: 3.8 g/dL (ref 3.5–5.0)
Alkaline Phosphatase: 74 U/L (ref 38–126)
Anion gap: 8 (ref 5–15)
BUN: 8 mg/dL (ref 6–20)
CO2: 28 mmol/L (ref 22–32)
Calcium: 9 mg/dL (ref 8.9–10.3)
Chloride: 103 mmol/L (ref 98–111)
Creatinine, Ser: 0.83 mg/dL (ref 0.61–1.24)
GFR calc Af Amer: >60 mL/min (ref >60)
GFR calc non Af Amer: >60 mL/min (ref >60)
Glucose, Bld: 126 mg/dL — ABNORMAL HIGH (ref 70–99)
Potassium: 3.9 mmol/L (ref 3.5–5.1)
Sodium: 139 mmol/L (ref 135–145)
Total Bilirubin: 0.9 mg/dL (ref 0.3–1.2)
Total Protein: 7 g/dL (ref 6.5–8.1)

## 2019-05-21 LAB — CBC
HCT: 43.5 % (ref 39.0–52.0)
Hemoglobin: 14.6 g/dL (ref 13.0–17.0)
MCH: 31.6 pg (ref 26.0–34.0)
MCHC: 33.6 g/dL (ref 30.0–36.0)
MCV: 94.2 fL (ref 80.0–100.0)
Platelets: 283 10*3/uL (ref 150–400)
RBC: 4.62 MIL/uL (ref 4.22–5.81)
RDW: 13 % (ref 11.5–15.5)
WBC: 9.1 10*3/uL (ref 4.0–10.5)
nRBC: 0 % (ref 0.0–0.2)

## 2019-05-21 SURGERY — COLONOSCOPY WITH PROPOFOL
Anesthesia: Monitor Anesthesia Care

## 2019-05-21 MED ORDER — SODIUM CHLORIDE 0.9 % IV SOLN: INTRAVENOUS | Status: DC

## 2019-05-21 MED ORDER — PROPOFOL 1000 MG/100ML IV EMUL
INTRAVENOUS | Status: AC
  Filled 2019-05-21: qty 100

## 2019-05-21 MED ORDER — PROPOFOL 500 MG/50ML IV EMUL
INTRAVENOUS | Status: AC
  Filled 2019-05-21: qty 50

## 2019-05-21 MED ORDER — LACTATED RINGERS IV SOLN: INTRAVENOUS | Status: DC

## 2019-05-21 MED ORDER — LIDOCAINE 2% (20 MG/ML) 5 ML SYRINGE
INTRAMUSCULAR | Status: DC | PRN
  Administered 2019-05-21: 100 mg via INTRAVENOUS

## 2019-05-21 MED ORDER — LABETALOL HCL 5 MG/ML IV SOLN
INTRAVENOUS | Status: DC | PRN
  Administered 2019-05-21: 5 mg via INTRAVENOUS

## 2019-05-21 MED ORDER — PROPOFOL 10 MG/ML IV BOLUS
INTRAVENOUS | Status: DC | PRN
  Administered 2019-05-21 (×2): 20 mg via INTRAVENOUS

## 2019-05-21 MED ORDER — PROPOFOL 500 MG/50ML IV EMUL
INTRAVENOUS | Status: DC | PRN
  Administered 2019-05-21: 120 ug/kg/min via INTRAVENOUS

## 2019-05-21 SURGICAL SUPPLY — 21 items

## 2019-05-21 NOTE — Interval H&P Note (Signed)
History and Physical Interval Note:  05/21/2019 10:31 AM  Bruce Macias  has presented today for surgery, with the diagnosis of Rectal bleeding and recurrent diverticulitis.  The various methods of treatment have been discussed with the patient and family. After consideration of risks, benefits and other options for treatment, the patient has consented to  Procedure(s): COLONOSCOPY WITH PROPOFOL (N/A) as a surgical intervention.  The patient's history has been reviewed, patient examined, no change in status, stable for surgery.  I have reviewed the patient's chart and labs.  Questions were answered to the patient's satisfaction.     Charlie Pitter III

## 2019-05-21 NOTE — Op Note (Signed)
[facility] Patient Name: Bruce Macias Procedure Date: 05/21/2019 MRN: [id number] Attending MD: Estill Cotta. Loletha Carrow , MD Date of Birth: [birth year] CSN: [id number] Age: 39 Admit Type: Outpatient Procedure:                Colonoscopy Indications:              Rectal bleeding, Follow-up of diverticulitis Providers:                Mallie Mussel L. Loletha Carrow, MD, Cleda Daub, RN, Elspeth Cho Tech., Technician, Danley Danker, CRNA Referring MD:              Medicines:                Monitored Anesthesia Care Complications:            No immediate complications. Estimated Blood Loss:     Estimated blood loss: none. Procedure:                Pre-Anesthesia Assessment:                           - Prior to the procedure, a History and Physical                            was performed, and patient medications and                            allergies were reviewed. The patient's tolerance of                            previous anesthesia was also reviewed. The risks                            and benefits of the procedure and the sedation                            options and risks were discussed with the patient.                            All questions were answered, and informed consent                            was obtained. Prior Anticoagulants: The patient has                            taken no previous anticoagulant or antiplatelet                            agents. ASA Grade Assessment: III - A patient with                            severe systemic disease. After reviewing the risks  and benefits, the patient was deemed in                            satisfactory condition to undergo the procedure.                           After obtaining informed consent, the colonoscope                            was passed under direct vision. Throughout the                            procedure, the patient's blood pressure, pulse, and                          oxygen saturations were monitored continuously. The                            [id number]-HQ190L ([id number]) Olympus colonoscope was                            introduced through the anus and advanced to the the                            cecum, identified by appendiceal orifice and                            ileocecal valve. The colonoscopy was performed                            without difficulty. The patient tolerated the                            procedure well. The quality of the bowel                            preparation was excellent. The ileocecal valve,                            appendiceal orifice, and rectum were photographed. Scope In: 11:50:02 AM Scope Out: 12:01:57 PM Scope Withdrawal Time: 0 hours 9 minutes 51 seconds  Total Procedure Duration: 0 hours 11 minutes 55 seconds  Findings:      The perianal and digital rectal examinations were normal.      Multiple diverticula were found in the left colon and right colon.      The exam was otherwise without abnormality on direct and retroflexion       views. Impression:               - Diverticulosis in the left colon and in the right                            colon.                           - The examination  was otherwise normal on direct                            and retroflexion views.                           - No specimens collected. Moderate Sedation:      MAC sedation used Recommendation:           - Patient has a contact number available for                            emergencies. The signs and symptoms of potential                            delayed complications were discussed with the                            patient. Return to normal activities tomorrow.                            Written discharge instructions were provided to the                            patient.                           - Resume previous diet.                           - Continue present medications.                            - Repeat colonoscopy in 10 years for screening                            purposes.                           - Adequate dietary fiber and water intake to avoid                            constipation. Procedure Code(s):        --- Professional ---                           [phone], Colonoscopy, flexible; diagnostic, including                            collection of specimen(s) by brushing or washing,                            when performed (separate procedure) Diagnosis Code(s):        --- Professional ---                           K62.5, Hemorrhage of anus and rectum  K57.32, Diverticulitis of large intestine without                            perforation or abscess without bleeding                           K57.30, Diverticulosis of large intestine without                            perforation or abscess without bleeding CPT copyright 2019 American Medical Association. All rights reserved. The codes documented in this report are preliminary and upon coder review may  be revised to meet current compliance requirements. Edelmiro Innocent L. Loletha Carrow, MD 05/21/2019 12:17:31 PM This report has been signed electronically. Number of Addenda: 0

## 2019-05-21 NOTE — Progress Notes (Addendum)
Istat x 2 shows potassium  7.0 and 6.9  Serum draw for BMP sent and pending.  Patient feeling well.  EKG normal.  Specifically, no peaked T waves or abnormal intervals  11:30 AM  _________________________________  Addendum 11:40 AM  BMP results:  Na 139    K  3.9    Cl  103   CO2   28   Glucose  126   BUN  8  Cr  0.83  Proceeding with colonoscopy.  - H. Myrtie Neither, MD

## 2019-05-21 NOTE — Discharge Instructions (Signed)
YOU HAD AN ENDOSCOPIC PROCEDURE TODAY: Refer to the procedure report and other information in the discharge instructions given to you for any specific questions about what was found during the examination. If this information does not answer your questions, please call [place] office at [phone] to clarify.  ° °YOU SHOULD EXPECT: Some feelings of bloating in the abdomen. Passage of more gas than usual. Walking can help get rid of the air that was put into your GI tract during the procedure and reduce the bloating. If you had a lower endoscopy (such as a colonoscopy or flexible sigmoidoscopy) you may notice spotting of blood in your stool or on the toilet paper. Some abdominal soreness may be present for a day or two, also. ° °DIET: Your first meal following the procedure should be a light meal and then it is ok to progress to your normal diet. A half-sandwich or bowl of soup is an example of a good first meal. Heavy or fried foods are harder to digest and may make you feel nauseous or bloated. Drink plenty of fluids but you should avoid alcoholic beverages for 24 hours. If you had a esophageal dilation, please see attached instructions for diet.   ° °ACTIVITY: Your care partner should take you home directly after the procedure. You should plan to take it easy, moving slowly for the rest of the day. You can resume normal activity the day after the procedure however YOU SHOULD NOT DRIVE, use power tools, machinery or perform tasks that involve climbing or major physical exertion for 24 hours (because of the sedation medicines used during the test).  ° °SYMPTOMS TO REPORT IMMEDIATELY: °A gastroenterologist can be reached at any hour. Please call [phone]  for any of the following symptoms:  °Following lower endoscopy (colonoscopy, flexible sigmoidoscopy) °Excessive amounts of blood in the stool  °Significant tenderness, worsening of abdominal pains  °Swelling of the abdomen that is new, acute  °Fever of 100° or  higher  °Following upper endoscopy (EGD, EUS, ERCP, esophageal dilation) °Vomiting of blood or coffee ground material  °New, significant abdominal pain  °New, significant chest pain or pain under the shoulder blades  °Painful or persistently difficult swallowing  °New shortness of breath  °Black, tarry-looking or red, bloody stools ° °FOLLOW UP:  °If any biopsies were taken you will be contacted by phone or by letter within the next 1-3 weeks. Call [phone]  if you have not heard about the biopsies in 3 weeks.  °Please also call with any specific questions about appointments or follow up tests. ° °

## 2019-05-21 NOTE — H&P (Signed)
  History:  This patient presents for endoscopic testing for rectal bleeding, recurrent diverticulitis.  Cem Zenovia Jordan Referring physician: Maryagnes Amos, FNP  Past Medical History: Past Medical History:  Diagnosis Date  . Boil 06/2010   LEFT inguinal  . Condyloma acuminatum of perianal region 06/2009   treated with imiquimod and then dermatology evaluation  . Diabetes mellitus without complication (HCC)   . Diverticulitis   . Erectile dysfunction   . Hyperlipidemia   . Hypertension   . Psoriasis   . Sleep apnea    wear CPAP  . Vitamin D deficiency 2011   took vit D 50,000 IU weekly for brief period     Past Surgical History: Past Surgical History:  Procedure Laterality Date  . abscess     inguinal fold    Allergies: No Known Allergies  Outpatient Meds: Current Facility-Administered Medications  Medication Dose Route Frequency Provider Last Rate Last Admin  . 0.9 %  sodium chloride infusion   Intravenous Continuous Danis, Starr Lake III, MD      . lactated ringers infusion   Intravenous Continuous Danis, Starr Lake III, MD      . lactated ringers infusion   Intravenous Continuous Danis, Starr Lake III, MD          ___________________________________________________________________ Objective   Exam:  BP (!) 164/104 Comment: Manual  Pulse 94   Temp 98.4 F (36.9 C) (Oral)   Resp 12   SpO2 98%    CV: RRR without murmur, S1/S2, no JVD, no peripheral edema  Resp: clear to auscultation bilaterally, normal RR and effort noted  GI: soft, no tenderness, with active bowel sounds. No guarding or palpable organomegaly noted.  Neuro: awake, alert and oriented x 3. Normal gross motor function and fluent speech   Assessment:  Rectal bleeding  His blood pressure is elevated today.  No chest pain, headache, visual disturbance, dyspnea. Anesthesia (Dr. Chilton Si) has evaluated and agreeable to proceed and treat blood pressure peri-op. I feel procedure is  urgent for rectal bleeding.  Patient and I discussed risks of proceeding today vs delaying case, and he would like to proceed.  About a month ago, his PCP did not refill his lisinopril, so he could not get it at the pharmacy and has therefore been without it since then. He reports calling that clinic, but not hearing back from them.  Plan:  Colonoscopy. I will prescribe this patient's lisinopril 10 mg once daily for 2 weeks, giving him time to contact primary care and rectify his blood pressure management plan.   Bruce Macias

## 2019-05-21 NOTE — Transfer of Care (Signed)
Immediate Anesthesia Transfer of Care Note  Patient: Bruce Macias  Procedure(s) Performed: COLONOSCOPY WITH PROPOFOL (N/A )  Patient Location: Endoscopy Unit  Anesthesia Type:MAC  Level of Consciousness: awake, alert  and oriented  Airway & Oxygen Therapy: Patient Spontanous Breathing and Patient connected to face mask oxygen  Post-op Assessment: Report given to RN and Post -op Vital signs reviewed and stable  Post vital signs: Reviewed and stable  Last Vitals:  Vitals Value Taken Time  BP    Temp    Pulse    Resp    SpO2      Last Pain:  Vitals:   05/21/19 0927  TempSrc: Oral  PainSc: 0-No pain         Complications: No apparent anesthesia complications

## 2019-05-21 NOTE — Anesthesia Preprocedure Evaluation (Signed)
Anesthesia Evaluation  Patient identified by MRN, date of birth, ID band Patient awake  General Assessment Comment:Case discussed with Dr. Loletha Carrow who stated patient's case was urgent due to bleeding history. Informed patient and Dr. Loletha Carrow of risks associated with HTN and both understood risks. Dr. Nyoka Cowden  Reviewed: Allergy & Precautions, NPO status , Patient's Chart, lab work & pertinent test results  Airway Mallampati: II  TM Distance: >3 FB     Dental   Pulmonary Current Smoker,    breath sounds clear to auscultation       Cardiovascular hypertension,  Rhythm:Regular Rate:Normal     Neuro/Psych    GI/Hepatic Neg liver ROS, History noted. CG   Endo/Other  diabetes  Renal/GU negative Renal ROS     Musculoskeletal   Abdominal   Peds  Hematology   Anesthesia Other Findings   Reproductive/Obstetrics                             Anesthesia Physical Anesthesia Plan  ASA: III  Anesthesia Plan: MAC   Post-op Pain Management:    Induction: Intravenous  PONV Risk Score and Plan: 1 and Ondansetron and Propofol infusion  Airway Management Planned: Nasal Cannula and Simple Face Mask  Additional Equipment:   Intra-op Plan:   Post-operative Plan:   Informed Consent: I have reviewed the patients History and Physical, chart, labs and discussed the procedure including the risks, benefits and alternatives for the proposed anesthesia with the patient or authorized representative who has indicated his/her understanding and acceptance.     Dental advisory given  Plan Discussed with: CRNA, Anesthesiologist and Surgeon  Anesthesia Plan Comments:         Anesthesia Quick Evaluation

## 2019-05-21 NOTE — Anesthesia Postprocedure Evaluation (Signed)
Anesthesia Post Note  Patient: Bruce Macias  Procedure(s) Performed: COLONOSCOPY WITH PROPOFOL (N/A )     Patient location during evaluation: Endoscopy Anesthesia Type: MAC Level of consciousness: awake Pain management: pain level controlled Respiratory status: spontaneous breathing Cardiovascular status: stable Postop Assessment: no apparent nausea or vomiting Anesthetic complications: no    Last Vitals:  Vitals:   05/21/19 1225 05/21/19 1230  BP:  (!) 150/93  Pulse: 85 86  Resp: 13 13  Temp:    SpO2: 96% 98%    Last Pain:  Vitals:   05/21/19 1225  TempSrc:   PainSc: 0-No pain                 [name]

## 2019-05-21 NOTE — Progress Notes (Signed)
I spoke with this patient after the procedure, and spoke with his wife saparately by phone with results and to discuss Bruce Macias's blood pressure.  She was aware that he had been off anti-hypertensive, but did not know why.  I explained elevated blood pressure issue today, that it is much improved post-procedure since medicine received. His wife Bruce Macias will call the PCP today and get a new prescription for blood pressure medicine sent to his pharmacy immediately,along with plans for office follow up with them.

## 2019-05-24 ENCOUNTER — Encounter: Payer: Self-pay | Admitting: *Deleted
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
## 2020-01-07 DIAGNOSIS — Z720 Tobacco use: Secondary | ICD-10-CM | POA: Diagnosis not present

## 2020-01-07 DIAGNOSIS — R062 Wheezing: Secondary | ICD-10-CM | POA: Diagnosis not present

## 2020-01-07 DIAGNOSIS — E119 Type 2 diabetes mellitus without complications: Secondary | ICD-10-CM | POA: Diagnosis not present

## 2020-01-07 DIAGNOSIS — I10 Essential (primary) hypertension: Secondary | ICD-10-CM | POA: Diagnosis not present

## 2020-01-07 DIAGNOSIS — R059 Cough, unspecified: Secondary | ICD-10-CM | POA: Diagnosis not present

## 2020-01-07 DIAGNOSIS — J40 Bronchitis, not specified as acute or chronic: Secondary | ICD-10-CM | POA: Diagnosis not present

## 2020-01-07 DIAGNOSIS — J209 Acute bronchitis, unspecified: Secondary | ICD-10-CM | POA: Diagnosis not present

## 2020-01-11 DIAGNOSIS — J4520 Mild intermittent asthma, uncomplicated: Secondary | ICD-10-CM | POA: Diagnosis not present

## 2020-01-11 DIAGNOSIS — I10 Essential (primary) hypertension: Secondary | ICD-10-CM | POA: Diagnosis not present

## 2020-01-11 DIAGNOSIS — E1169 Type 2 diabetes mellitus with other specified complication: Secondary | ICD-10-CM | POA: Diagnosis not present

## 2020-01-11 DIAGNOSIS — E785 Hyperlipidemia, unspecified: Secondary | ICD-10-CM | POA: Diagnosis not present

## 2020-04-08 IMAGING — CT CT ABDOMEN AND PELVIS WITH CONTRAST
2 of 4 series · 16 of 46 positions shown, 18 images · IV contrast (APPLIED)
Comparison: None.

CLINICAL DATA: Abdominal pain, diverticulitis suspected

EXAM:
CT ABDOMEN AND PELVIS WITH CONTRAST
TECHNIQUE: Multidetector CT imaging of the abdomen and pelvis was performed
using the standard protocol following bolus administration of
intravenous contrast.
CONTRAST:  100mL OMNIPAQUE IOHEXOL 300 MG/ML  SOLN

[Series 2: axial st · axial · 0.85mm/px · z∈[+40,+484]mm · 13 of 99 slices shown, 15 images]
[im 5/99  soft-tissue]
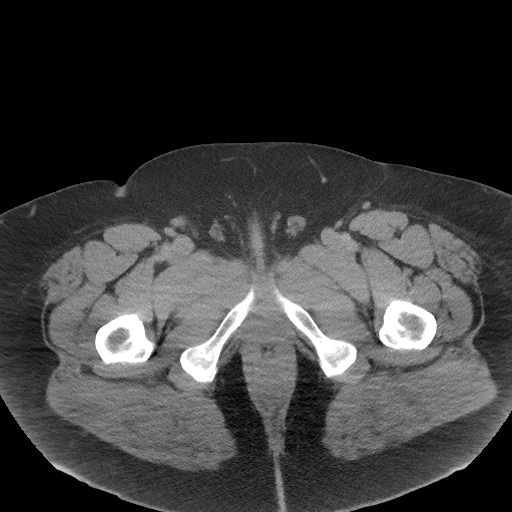
[im 5/99  bone]
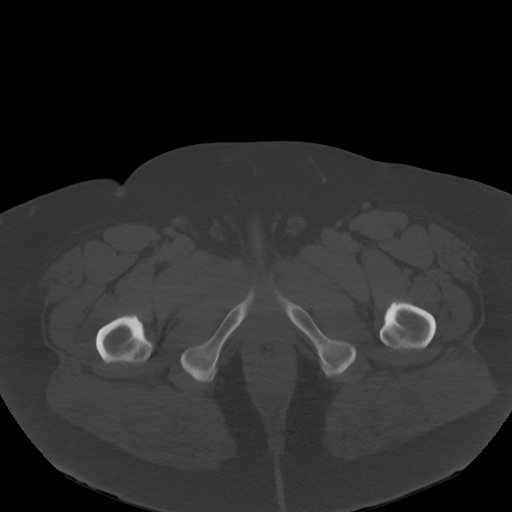
[im 13/99  soft-tissue]
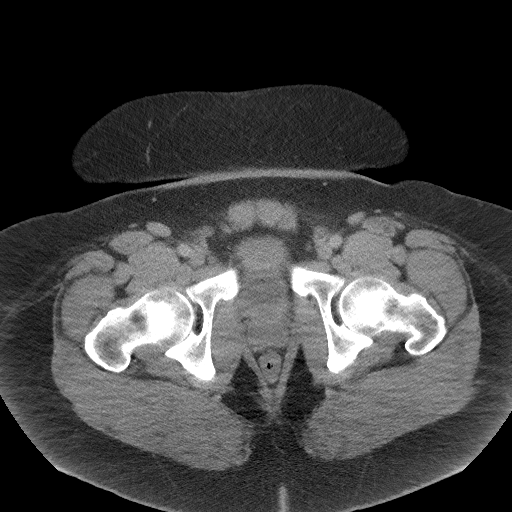
[im 22/99  soft-tissue]
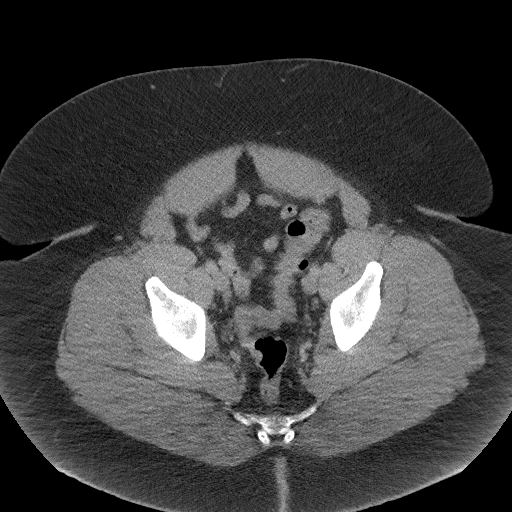
[im 26/99  soft-tissue]
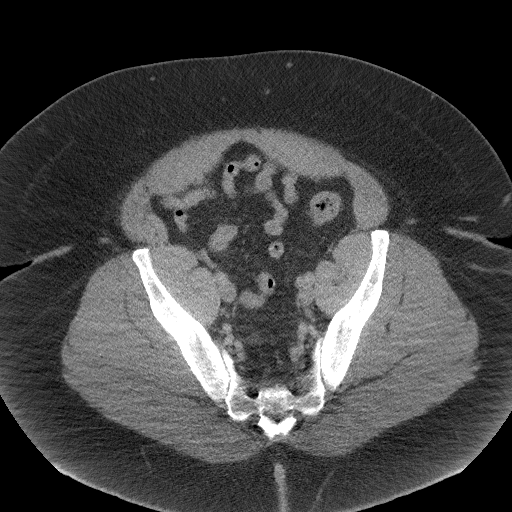
[im 35/99  soft-tissue]
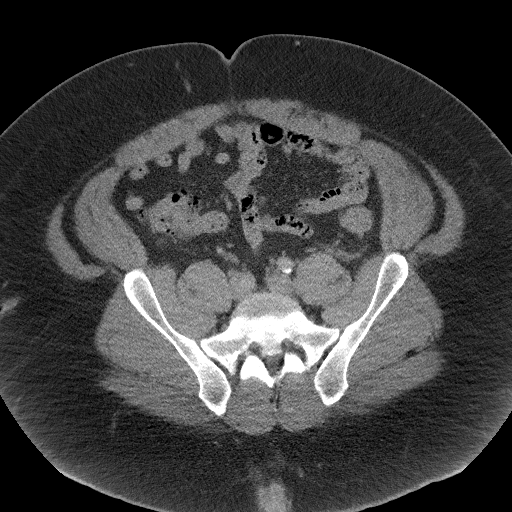
[im 43/99  soft-tissue]
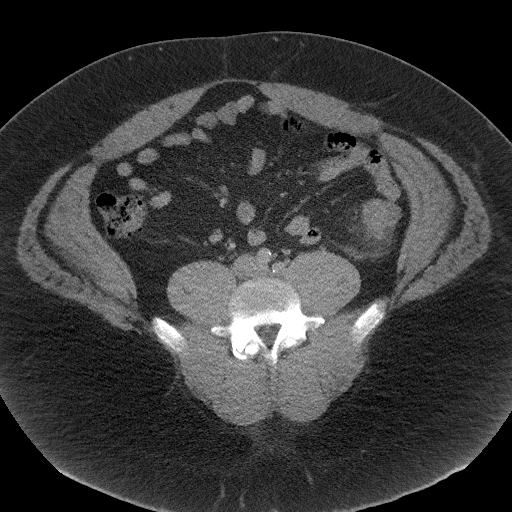
[im 52/99  soft-tissue]
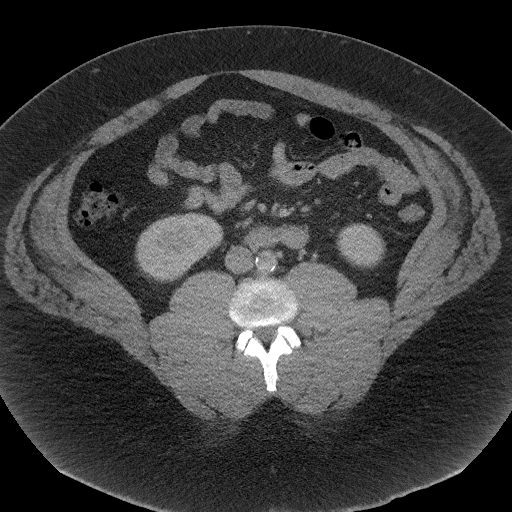
[im 56/99  soft-tissue]
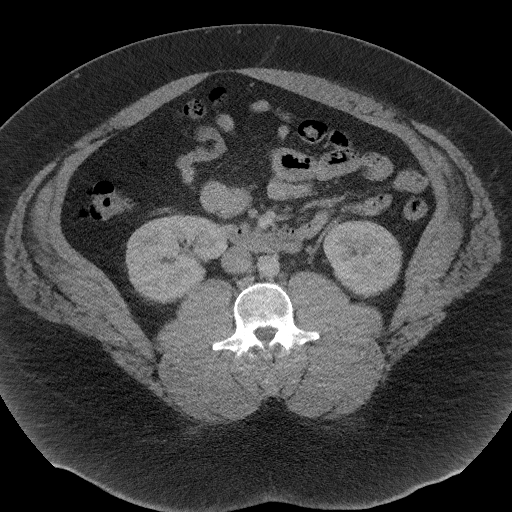
[im 64/99  soft-tissue]
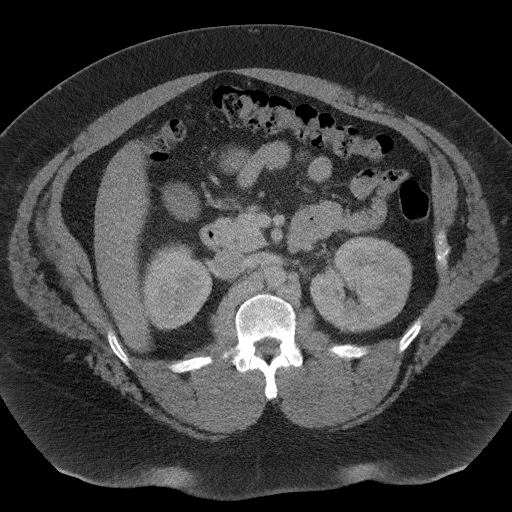
[im 64/99  bone]
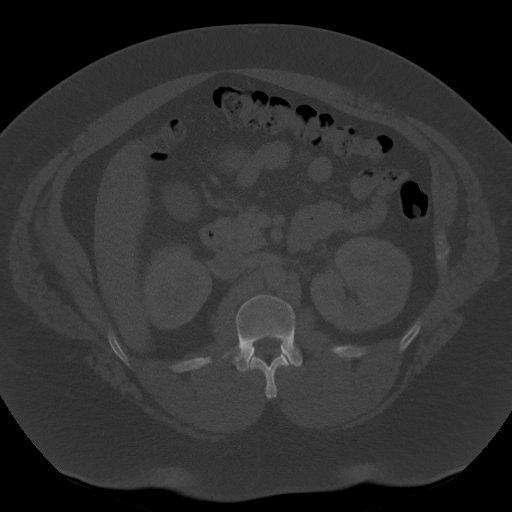
[im 73/99  soft-tissue]
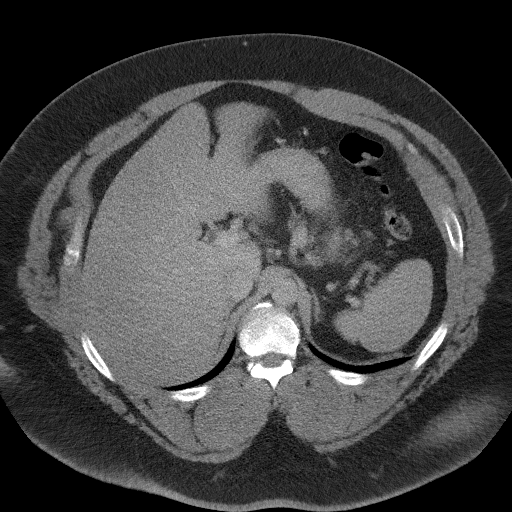
[im 77/99  soft-tissue]
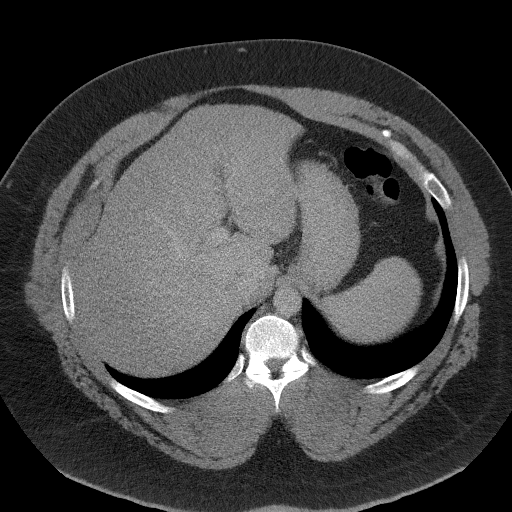
[im 86/99  soft-tissue]
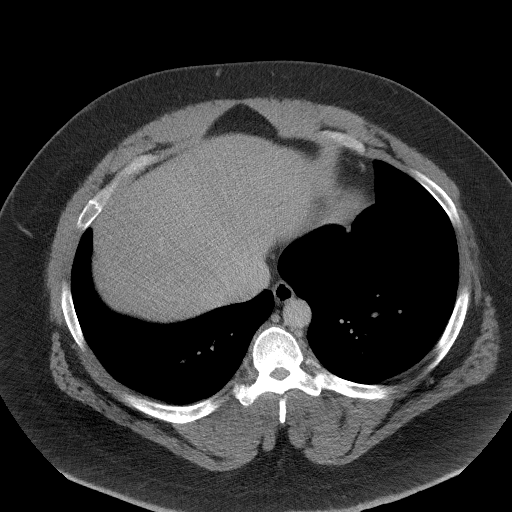
[im 94/99  soft-tissue]
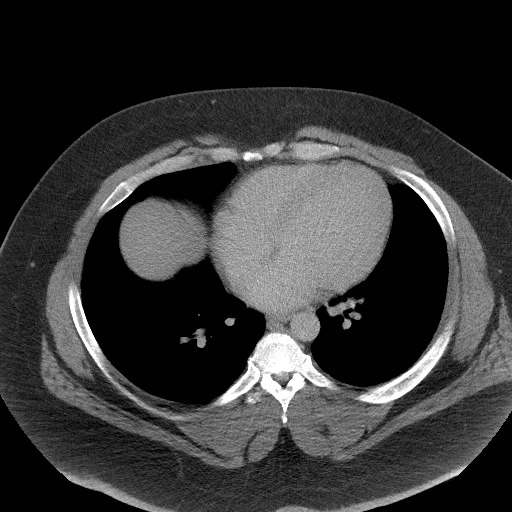

[Series 5: coronal st · coronal · 0.91mm/px · 3 of 113 slices shown]
[im 38/113  soft-tissue]
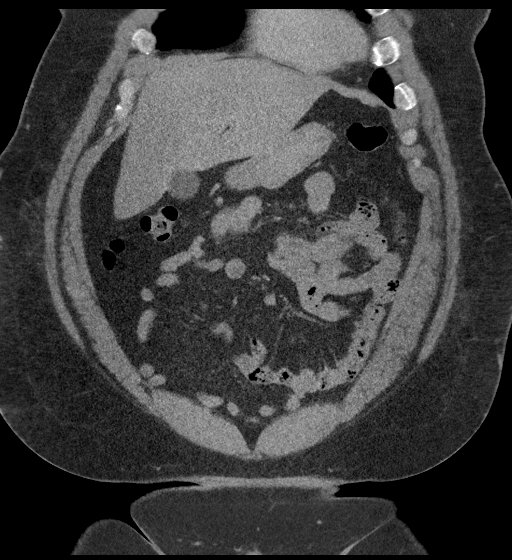
[im 50/113  soft-tissue]
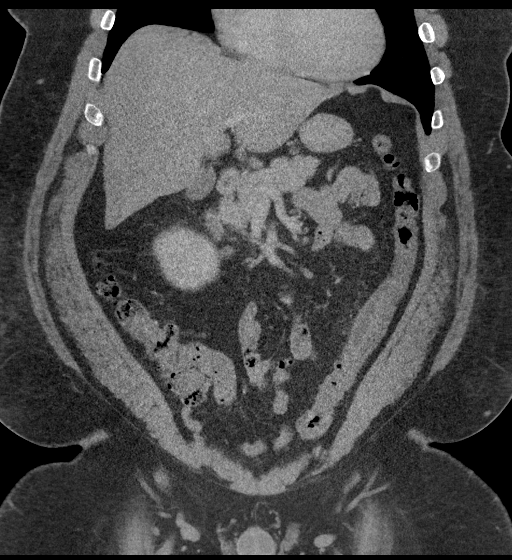
[im 63/113  soft-tissue]
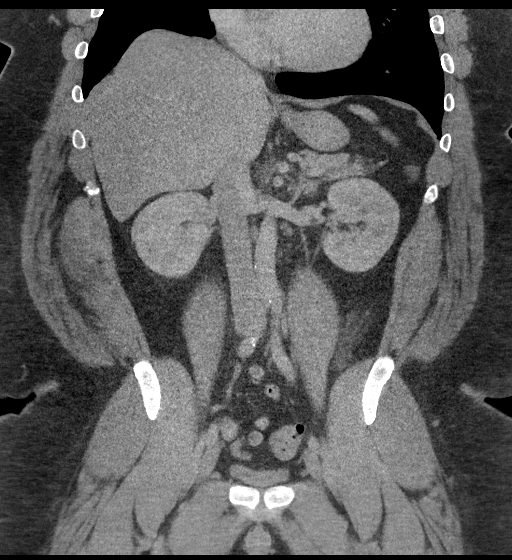

[16 of 46 positions shown; findings below may reference images not displayed]

FINDINGS: Lower chest: Lung bases are clear. Normal heart size. No pericardial
effusion. Right diaphragmatic eventration, similar to prior exam.

Hepatobiliary: No focal liver abnormality is seen. No gallstones,
gallbladder wall thickening, or biliary dilatation.

Pancreas: Unremarkable. No pancreatic ductal dilatation or
surrounding inflammatory changes.

Spleen: Normal in size without focal abnormality.

Adrenals/Urinary Tract: Adrenal glands are unremarkable. Kidneys are
normal, without renal calculi, focal lesion, or hydronephrosis.
Bladder is unremarkable.

Stomach/Bowel: Distal esophagus, stomach and duodenal sweep are
unremarkable. No small bowel wall thickening or dilatation. No
evidence of obstruction. There are scattered colonic diverticula.
Focal pericolonic inflammation appears centered upon a culprit
diverticulum in the distal descending colon (axial 57). Adjacent
phlegmonous stranding and trace reactive free fluid tracking in the
left paracolic gutter without organized collection, abscess or
extraluminal gas.

Vascular/Lymphatic: Atherosclerotic plaque within the normal caliber
aorta.

Reproductive: The prostate and seminal vesicles are unremarkable.

Other: No abdominopelvic free fluid or free gas. No bowel containing
hernias. Fat containing umbilical hernia

Musculoskeletal: No acute or significant osseous findings. Mild
facet arthropathy at L5-S1.
IMPRESSION: Acute uncomplicated diverticulitis of the distal descending colon.
No evidence of perforation or abscess formation.

Aortic Atherosclerosis ([id number]).

## 2021-01-26 DIAGNOSIS — I10 Essential (primary) hypertension: Secondary | ICD-10-CM | POA: Diagnosis not present

## 2021-01-26 DIAGNOSIS — E1165 Type 2 diabetes mellitus with hyperglycemia: Secondary | ICD-10-CM | POA: Diagnosis not present

## 2021-01-26 DIAGNOSIS — E785 Hyperlipidemia, unspecified: Secondary | ICD-10-CM | POA: Diagnosis not present
